# Patient Record
Sex: Female | Born: 1975
Health system: Southern US, Community
[De-identification: ages and names within clinical notes are randomized; demographics above are authoritative.]

## PROBLEM LIST (undated history)

## (undated) DIAGNOSIS — B019 Varicella without complication: Secondary | ICD-10-CM

## (undated) DIAGNOSIS — D219 Benign neoplasm of connective and other soft tissue, unspecified: Secondary | ICD-10-CM

## (undated) DIAGNOSIS — I429 Cardiomyopathy, unspecified: Secondary | ICD-10-CM

## (undated) DIAGNOSIS — I10 Essential (primary) hypertension: Secondary | ICD-10-CM

## (undated) DIAGNOSIS — N390 Urinary tract infection, site not specified: Secondary | ICD-10-CM

## (undated) DIAGNOSIS — R7303 Prediabetes: Secondary | ICD-10-CM

## (undated) DIAGNOSIS — K7689 Other specified diseases of liver: Secondary | ICD-10-CM

## (undated) DIAGNOSIS — J020 Streptococcal pharyngitis: Secondary | ICD-10-CM

## (undated) DIAGNOSIS — K219 Gastro-esophageal reflux disease without esophagitis: Secondary | ICD-10-CM

## (undated) HISTORY — DX: Gastro-esophageal reflux disease without esophagitis: K21.9

## (undated) HISTORY — DX: Essential (primary) hypertension: I10

## (undated) HISTORY — DX: Benign neoplasm of connective and other soft tissue, unspecified: D21.9

## (undated) HISTORY — DX: Cardiomyopathy, unspecified: I42.9

## (undated) HISTORY — DX: Prediabetes: R73.03

## (undated) HISTORY — DX: Other specified diseases of liver: K76.89

## (undated) HISTORY — DX: Urinary tract infection, site not specified: N39.0

## (undated) HISTORY — DX: Varicella without complication: B01.9

## (undated) HISTORY — DX: Streptococcal pharyngitis: J02.0

## (undated) HISTORY — PX: CHOLECYSTECTOMY: SHX55

---

## 2001-05-24 HISTORY — PX: CHOLECYSTECTOMY: SHX55

## 2004-06-16 ENCOUNTER — Inpatient Hospital Stay: Payer: Self-pay

## 2004-07-17 ENCOUNTER — Emergency Department: Payer: Self-pay | Admitting: Emergency Medicine

## 2004-12-23 ENCOUNTER — Inpatient Hospital Stay: Payer: Self-pay

## 2005-03-07 ENCOUNTER — Other Ambulatory Visit: Payer: Self-pay

## 2005-03-08 ENCOUNTER — Inpatient Hospital Stay: Payer: Self-pay | Admitting: Cardiovascular Disease

## 2005-06-27 ENCOUNTER — Emergency Department: Payer: Self-pay | Admitting: Emergency Medicine

## 2006-06-16 ENCOUNTER — Emergency Department: Payer: Self-pay | Admitting: Emergency Medicine

## 2006-06-20 ENCOUNTER — Ambulatory Visit: Payer: Self-pay | Admitting: Emergency Medicine

## 2006-09-02 ENCOUNTER — Ambulatory Visit: Payer: Self-pay | Admitting: Internal Medicine

## 2007-02-09 ENCOUNTER — Emergency Department: Payer: Self-pay | Admitting: Emergency Medicine

## 2008-01-31 ENCOUNTER — Emergency Department: Payer: Self-pay | Admitting: Emergency Medicine

## 2008-01-31 ENCOUNTER — Other Ambulatory Visit: Payer: Self-pay

## 2008-04-30 ENCOUNTER — Ambulatory Visit: Payer: Self-pay | Admitting: Gastroenterology

## 2009-09-08 ENCOUNTER — Ambulatory Visit: Payer: Self-pay | Admitting: General Practice

## 2009-09-09 ENCOUNTER — Ambulatory Visit: Payer: Self-pay | Admitting: General Practice

## 2013-02-15 ENCOUNTER — Encounter: Payer: Self-pay | Admitting: *Deleted

## 2013-02-15 ENCOUNTER — Telehealth: Payer: Self-pay | Admitting: *Deleted

## 2013-02-15 DIAGNOSIS — L732 Hidradenitis suppurativa: Secondary | ICD-10-CM

## 2013-02-15 MED ORDER — DOXYCYCLINE HYCLATE 100 MG PO TABS: 100.0000 mg | ORAL_TABLET | Freq: Every day | ORAL | Status: DC

## 2013-02-15 NOTE — Telephone Encounter (Signed)
Patient states that while she was on antibiotics the area is fine and wants to know if she can get a months RX.  She declined an appointment  And if she needs surgery she wants it next year.  She did get cardiac clearance last year for surgery, she sees Dr. Youlanda Mighty in [place].

## 2014-05-10 DIAGNOSIS — G4733 Obstructive sleep apnea (adult) (pediatric): Secondary | ICD-10-CM

## 2014-05-10 HISTORY — DX: Obstructive sleep apnea (adult) (pediatric): G47.33

## 2015-08-04 ENCOUNTER — Encounter: Payer: Self-pay | Admitting: Family Medicine

## 2015-08-04 ENCOUNTER — Ambulatory Visit (INDEPENDENT_AMBULATORY_CARE_PROVIDER_SITE_OTHER): Payer: BLUE CROSS/BLUE SHIELD | Admitting: Family Medicine

## 2015-08-04 VITALS — BP 110/76 | HR 88 | Temp 98.4°F | Ht 64.5 in | Wt 237.4 lb

## 2015-08-04 DIAGNOSIS — M545 Low back pain, unspecified: Secondary | ICD-10-CM | POA: Insufficient documentation

## 2015-08-04 DIAGNOSIS — L732 Hidradenitis suppurativa: Secondary | ICD-10-CM

## 2015-08-04 DIAGNOSIS — M6281 Muscle weakness (generalized): Secondary | ICD-10-CM | POA: Diagnosis not present

## 2015-08-04 DIAGNOSIS — R29898 Other symptoms and signs involving the musculoskeletal system: Secondary | ICD-10-CM

## 2015-08-04 DIAGNOSIS — R413 Other amnesia: Secondary | ICD-10-CM | POA: Insufficient documentation

## 2015-08-04 HISTORY — DX: Hidradenitis suppurativa: L73.2

## 2015-08-04 LAB — SEDIMENTATION RATE: Sed Rate: 24 mm/hr — ABNORMAL HIGH (ref 0–22)

## 2015-08-04 LAB — CBC
HEMATOCRIT: 36.4 % (ref 36.0–46.0)
Hemoglobin: 12 g/dL (ref 12.0–15.0)
MCHC: 33 g/dL (ref 30.0–36.0)
MCV: 85.3 fl (ref 78.0–100.0)
PLATELETS: 276 10*3/uL (ref 150.0–400.0)
RBC: 4.27 Mil/uL (ref 3.87–5.11)
RDW: 15.1 % (ref 11.5–15.5)
WBC: 8.5 10*3/uL (ref 4.0–10.5)

## 2015-08-04 LAB — COMPREHENSIVE METABOLIC PANEL
ALT: 13 U/L (ref 0–35)
AST: 14 U/L (ref 0–37)
Albumin: 4.2 g/dL (ref 3.5–5.2)
Alkaline Phosphatase: 66 U/L (ref 39–117)
BUN: 12 mg/dL (ref 6–23)
CALCIUM: 9.3 mg/dL (ref 8.4–10.5)
CHLORIDE: 103 meq/L (ref 96–112)
CO2: 29 meq/L (ref 19–32)
Creatinine, Ser: 0.82 mg/dL (ref 0.40–1.20)
GFR: 99.04 mL/min (ref >60.00)
Glucose, Bld: 86 mg/dL (ref 70–99)
POTASSIUM: 4 meq/L (ref 3.5–5.1)
Sodium: 138 mEq/L (ref 135–145)
Total Bilirubin: 0.5 mg/dL (ref 0.2–1.2)
Total Protein: 7.4 g/dL (ref 6.0–8.3)

## 2015-08-04 LAB — CK: Total CK: 75 U/L (ref 7–177)

## 2015-08-04 LAB — TSH: TSH: 0.54 u[IU]/mL (ref 0.35–4.50)

## 2015-08-04 MED ORDER — CHLORZOXAZONE 500 MG PO TABS: 250.0000 mg | ORAL_TABLET | Freq: Three times a day (TID) | ORAL | Status: DC | PRN

## 2015-08-04 NOTE — Assessment & Plan Note (Signed)
Patient reports subjective upper extremity weakness for a number of years. She is neurologically intact at this time. There is no muscle tenderness or swelling in her upper extremities. No neck pain with this to indicate nerve entrapment. Unsure of cause at this time. Could consider myasthenia gravis as possible. We'll check lab work as outlined below. If negative for cause would consider referral to neurology for further evaluation. Given return precautions.

## 2015-08-04 NOTE — Progress Notes (Signed)
Pre visit review using our clinic review tool, if applicable. No additional management support is needed unless otherwise documented below in the visit note. 

## 2015-08-04 NOTE — Progress Notes (Signed)
Patient ID: Stacie Gill, female   DOB: 1975-06-09, 40 y.o.   MRN: [id number]  Stacie Rumps, MD Phone: [phone]  Stacie Gill is a 40 y.o. female who presents today for new patient visit  Low back pain: Patient notes for the last 1.5 months she has had low back pain near the middle of her lower spine. Notes initially was stiff with difficulty standing straight up, then she developed quick and intense shooting pain in that one specific area. Other times the pain just sits there. One time she had discomfort down the lateral aspect of her right leg, though this has not happened in several weeks. She denies injury. She does note she fell on her back 4 years ago, though had no pain following that. She denies lower extremity numbness, weakness, bowel or bladder incontinence, saddle anesthesia, fevers, and history of cancer. She has been taking intermittent ibuprofen at 400 mg once a day.  Memory issues: Patient notes for a number of years she has had a hard time remembering certain things. Difficulty recalling dates from her past such as her wedding date. Difficulty recalling teacher names of her children. She notes it just takes her longer to recall these. She has no IADL her ADL issues. No family history of dementia.  Bilateral upper extremity weakness: Patient notes for years she has had weakness in her upper extremities. She notes this occurs if she uses her upper extremities for prolonged periods of time. She notes if she holds a TV remote up her arm will become weak to the point that she cannot hold it up any longer. No numbness. No tingling. No neck pain. It does not get progressively worse throughout the day. No vision changes. No muscle pain or shoulder pain. No shoulder stiffness.  Hidradenitis: Patient notes long history of bilateral armpit hidradenitis. She has seen a Psychologist, sport and exercise for this and it was advised that she have surgery for this. She did not proceed with this. She notes  intermittently she will develop lesions that will drain eventually on their own. No fevers. She wonders if seeing a dermatologist would be more beneficial.   Active Ambulatory Problems    Diagnosis Date Noted  . Low back pain 08/04/2015  . Upper extremity weakness 08/04/2015  . Memory change 08/04/2015  . Hidradenitis 08/04/2015   Resolved Ambulatory Problems    Diagnosis Date Noted  . No Resolved Ambulatory Problems   Past Medical History  Diagnosis Date  . Chickenpox   . Cardiomyopathy ([place])   . GERD (gastroesophageal reflux disease)   . Hypertension   . UTI (lower urinary tract infection)     Family History  Problem Relation Age of Onset  . Alcoholism      Uncle  . Arthritis      Grandparent  . Colon cancer Other     Great grandparent, great uncle  . Uterine cancer Maternal Aunt   . Breast cancer Cousin   . Prostate cancer Father   . Stroke Other     Great grandparent  . Hypertension      Parent  . Bone cancer      Uncle    Social History   Social History  . Marital Status: Married    Spouse Name: N/A  . Number of Children: N/A  . Years of Education: N/A   Occupational History  . Not on file.   Social History Main Topics  . Smoking status: Never Smoker   . Smokeless tobacco: Not on  file  . Alcohol Use: 0.0 oz/week    0 Standard drinks or equivalent per week     Comment: 1 a month  . Drug Use: No  . Sexual Activity: Not on file   Other Topics Concern  . Not on file   Social History Narrative  . No narrative on file    ROS   General:  Negative for nexplained weight loss, fever Skin: Negative for new or changing mole, sore that won't heal HEENT: Negative for trouble hearing, trouble seeing, ringing in ears, mouth sores, hoarseness, change in voice, dysphagia. CV:  Negative for chest pain, dyspnea, edema, palpitations Resp: Negative for cough, dyspnea, hemoptysis GI: Negative for nausea, vomiting, diarrhea, constipation, abdominal pain,  melena, hematochezia. GU: Negative for dysuria, incontinence, urinary hesitance, hematuria, vaginal or penile discharge, polyuria, sexual difficulty, lumps in testicle or breasts MSK: Positive for back pain, Negative for muscle cramps or aches, joint pain or swelling Neuro: Negative for headaches, weakness, numbness, dizziness, passing out/fainting Psych: Negative for depression, anxiety, positive for memory problems  Objective  Physical Exam Filed Vitals:   08/04/15 1326  BP: 110/76  Pulse: 88  Temp: 98.4 F (36.9 C)    BP Readings from Last 3 Encounters:  08/04/15 110/76   Wt Readings from Last 3 Encounters:  08/04/15 237 lb 6.4 oz (107.684 kg)    Physical Exam  Constitutional: She is well-developed, well-nourished, and in no distress.  HENT:  Head: Normocephalic and atraumatic.  Right Ear: External ear normal.  Left Ear: External ear normal.  Mouth/Throat: Oropharynx is clear and moist. No oropharyngeal exudate.  Eyes: Conjunctivae are normal. Pupils are equal, round, and reactive to light.  Neck: Neck supple.  Cardiovascular: Normal rate, regular rhythm and normal heart sounds.  Exam reveals no gallop and no friction rub.   No murmur heard. Pulmonary/Chest: Effort normal and breath sounds normal. No respiratory distress. She has no wheezes. She has no rales.  Abdominal: Soft. Bowel sounds are normal. She exhibits no distension. There is no tenderness. There is no rebound and no guarding.  Musculoskeletal: She exhibits no edema or tenderness (in bilateral upper extremities).  No midline spine tenderness, no midline spine step-off, minimal tenderness over the right lumbar paraspinous muscles at about the level of the iliac crest, no swelling or skin changes, no other tenderness in her back  Lymphadenopathy:    She has no cervical adenopathy.  Neurological: She is alert.  5/5 strength in bilateral biceps, triceps, grip, deltoids, quads, hamstrings, plantar and dorsiflexion,  sensation to light touch intact in bilateral UE and LE, normal gait, 2+ patellar reflexes  Skin: Skin is warm and dry. She is not diaphoretic.  Bilateral axillas with small firm palpable nodules with no fluctuance, warmth, or overlying erythema  Psychiatric: Mood, memory and affect normal.  5 out of 5 on mini cog testing     Assessment/Plan:   Low back pain Low back pain likely muscle strain versus muscular injury. Neurologically intact. No red flags. Discussed no role for imaging at this time. We'll treat with muscle relaxer chlorzoxazone. Discussed minimizing use of ibuprofen given her history of cardiomyopathy. Advised heat. Given exercises and stretches to complete. Patient will continue to monitor. She's given return precautions.  Upper extremity weakness Patient reports subjective upper extremity weakness for a number of years. She is neurologically intact at this time. There is no muscle tenderness or swelling in her upper extremities. No neck pain with this to indicate nerve entrapment. Unsure of  cause at this time. Could consider myasthenia gravis as possible. We'll check lab work as outlined below. If negative for cause would consider referral to neurology for further evaluation. Given return precautions.  Memory change Memory appears to be intact today. Passed the mini cog. No IADL or ADL deficits. We will continue to monitor this. If worsens or remains persistent would consider lab workup of this issue versus referral to neurology. Given return precautions.  Hidradenitis Chronic issue. No signs of acute infection at this time. Can use warm compresses. We will refer to dermatology for second opinion and further evaluation. Given return precautions.    Orders Placed This Encounter  Procedures  . Comp Met (CMET)  . CBC  . CK (Creatine Kinase)  . Sed Rate (ESR)  . TSH  . Ambulatory referral to Dermatology    Referral Priority:  Routine    Referral Type:  Consultation     Referral Reason:  Specialty Services Required    Requested Specialty:  Dermatology    Number of Visits Requested:  1    Stacie Rumps, MD [place]

## 2015-08-04 NOTE — Patient Instructions (Signed)
Nice to meet you. We will treat your back pain with a muscle relaxer called chlorzoxazone. You can take this up to every 8 hours as needed for your back pain. I would try to limit ibuprofen use given your cardiomyopathy. Please continue to monitor your memory area. If you have further issues with this please let us know. You can additionally use heat and the following stretches and exercises for your back pain. If you develop worsening back pain, numbness, weakness, loss of bowel or bladder function, numbness between her legs, fevers, or any new or changing symptoms please seek medical attention.  Back Exercises The following exercises strengthen the muscles that help to support the back. They also help to keep the lower back flexible. Doing these exercises can help to prevent back pain or lessen existing pain. If you have back pain or discomfort, try doing these exercises 2-3 times each day or as told by your health care provider. When the pain goes away, do them once each day, but increase the number of times that you repeat the steps for each exercise (do more repetitions). If you do not have back pain or discomfort, do these exercises once each day or as told by your health care provider. EXERCISES Single Knee to Chest Repeat these steps 3-5 times for each leg: 1. Lie on your back on a firm bed or the floor with your legs extended. 2. Bring one knee to your chest. Your other leg should stay extended and in contact with the floor. 3. Hold your knee in place by grabbing your knee or thigh. 4. Pull on your knee until you feel a gentle stretch in your lower back. 5. Hold the stretch for 10-30 seconds. 6. Slowly release and straighten your leg. Pelvic Tilt Repeat these steps 5-10 times: 1. Lie on your back on a firm bed or the floor with your legs extended. 2. Bend your knees so they are pointing toward the ceiling and your feet are flat on the floor. 3. Tighten your lower abdominal muscles to  press your lower back against the floor. This motion will tilt your pelvis so your tailbone points up toward the ceiling instead of pointing to your feet or the floor. 4. With gentle tension and even breathing, hold this position for 5-10 seconds. Cat-Cow Repeat these steps until your lower back becomes more flexible: 1. Get into a hands-and-knees position on a firm surface. Keep your hands under your shoulders, and keep your knees under your hips. You may place padding under your knees for comfort. 2. Let your head hang down, and point your tailbone toward the floor so your lower back becomes rounded like the back of a cat. 3. Hold this position for 5 seconds. 4. Slowly lift your head and point your tailbone up toward the ceiling so your back forms a sagging arch like the back of a cow. 5. Hold this position for 5 seconds. Press-Ups Repeat these steps 5-10 times: 1. Lie on your abdomen (face-down) on the floor. 2. Place your palms near your head, about shoulder-width apart. 3. While you keep your back as relaxed as possible and keep your hips on the floor, slowly straighten your arms to raise the top half of your body and lift your shoulders. Do not use your back muscles to raise your upper torso. You may adjust the placement of your hands to make yourself more comfortable. 4. Hold this position for 5 seconds while you keep your back relaxed. 5. Slowly  return to lying flat on the floor. Bridges Repeat these steps 10 times: 1. Lie on your back on a firm surface. 2. Bend your knees so they are pointing toward the ceiling and your feet are flat on the floor. 3. Tighten your buttocks muscles and lift your buttocks off of the floor until your waist is at almost the same height as your knees. You should feel the muscles working in your buttocks and the back of your thighs. If you do not feel these muscles, slide your feet 1-2 inches farther away from your buttocks. 4. Hold this position for 3-5  seconds. 5. Slowly lower your hips to the starting position, and allow your buttocks muscles to relax completely. If this exercise is too easy, try doing it with your arms crossed over your chest. Abdominal Crunches Repeat these steps 5-10 times: 1. Lie on your back on a firm bed or the floor with your legs extended. 2. Bend your knees so they are pointing toward the ceiling and your feet are flat on the floor. 3. Cross your arms over your chest. 4. Tip your chin slightly toward your chest without bending your neck. 5. Tighten your abdominal muscles and slowly raise your trunk (torso) high enough to lift your shoulder blades a tiny bit off of the floor. Avoid raising your torso higher than that, because it can put too much stress on your low back and it does not help to strengthen your abdominal muscles. 6. Slowly return to your starting position. Back Lifts Repeat these steps 5-10 times: 1. Lie on your abdomen (face-down) with your arms at your sides, and rest your forehead on the floor. 2. Tighten the muscles in your legs and your buttocks. 3. Slowly lift your chest off of the floor while you keep your hips pressed to the floor. Keep the back of your head in line with the curve in your back. Your eyes should be looking at the floor. 4. Hold this position for 3-5 seconds. 5. Slowly return to your starting position. SEEK MEDICAL CARE IF:  Your back pain or discomfort gets much worse when you do an exercise.  Your back pain or discomfort does not lessen within 2 hours after you exercise. If you have any of these problems, stop doing these exercises right away. Do not do them again unless your health care provider says that you can. SEEK IMMEDIATE MEDICAL CARE IF:  You develop sudden, severe back pain. If this happens, stop doing the exercises right away. Do not do them again unless your health care provider says that you can.   This information is not intended to replace advice given to  you by your health care provider. Make sure you discuss any questions you have with your health care provider.   Document Released: 06/17/2004 Document Revised: 01/29/2015 Document Reviewed: 07/04/2014 Elsevier Interactive Patient Education [employer].

## 2015-08-04 NOTE — Assessment & Plan Note (Signed)
Low back pain likely muscle strain versus muscular injury. Neurologically intact. No red flags. Discussed no role for imaging at this time. We'll treat with muscle relaxer chlorzoxazone. Discussed minimizing use of ibuprofen given her history of cardiomyopathy. Advised heat. Given exercises and stretches to complete. Patient will continue to monitor. She's given return precautions.

## 2015-08-04 NOTE — Assessment & Plan Note (Signed)
Memory appears to be intact today. Passed the mini cog. No IADL or ADL deficits. We will continue to monitor this. If worsens or remains persistent would consider lab workup of this issue versus referral to neurology. Given return precautions.

## 2015-08-04 NOTE — Assessment & Plan Note (Signed)
Chronic issue. No signs of acute infection at this time. Can use warm compresses. We will refer to dermatology for second opinion and further evaluation. Given return precautions.

## 2015-08-05 ENCOUNTER — Encounter: Payer: Self-pay | Admitting: Family Medicine

## 2015-08-13 ENCOUNTER — Other Ambulatory Visit: Payer: Self-pay | Admitting: Family Medicine

## 2015-08-13 DIAGNOSIS — R700 Elevated erythrocyte sedimentation rate: Secondary | ICD-10-CM

## 2015-08-18 ENCOUNTER — Other Ambulatory Visit (INDEPENDENT_AMBULATORY_CARE_PROVIDER_SITE_OTHER): Payer: BLUE CROSS/BLUE SHIELD

## 2015-08-18 DIAGNOSIS — R700 Elevated erythrocyte sedimentation rate: Secondary | ICD-10-CM | POA: Diagnosis not present

## 2015-08-18 LAB — SEDIMENTATION RATE: SED RATE: 24 mm/h — AB (ref 0–22)

## 2015-08-21 LAB — HM MAMMOGRAPHY

## 2015-08-23 ENCOUNTER — Encounter: Payer: Self-pay | Admitting: Gynecology

## 2015-08-23 ENCOUNTER — Ambulatory Visit
Admission: EM | Admit: 2015-08-23 | Discharge: 2015-08-23 | Disposition: A | Discharge status: Home / Self Care | Payer: BLUE CROSS/BLUE SHIELD | Attending: Family Medicine | Admitting: Family Medicine

## 2015-08-23 DIAGNOSIS — J020 Streptococcal pharyngitis: Secondary | ICD-10-CM

## 2015-08-23 LAB — RAPID STREP SCREEN (MED CTR MEBANE ONLY): Streptococcus, Group A Screen (Direct): POSITIVE — AB

## 2015-08-23 MED ORDER — PENICILLIN G BENZATHINE 1200000 UNIT/2ML IM SUSP
1.2000 10*6.[IU] | Freq: Once | INTRAMUSCULAR | Status: AC
  Administered 2015-08-23: 1.2 10*6.[IU] via INTRAMUSCULAR

## 2015-08-23 NOTE — ED Provider Notes (Signed)
CSN: [id number]     Arrival date & time 08/23/15  1539 History   First MD Initiated Contact with Patient 08/23/15 1549    Nurses notes were reviewed. Chief Complaint  Patient presents with  . Sore Throat   Up patient reports having a sore throat for 2 days. She states that no symptoms of flu but still just continued to be sore. Past medical history she's had a history of card to myopathy at the childbirth which course ended any further childbirth in the near future. She has a history of multiple types of cancers in family members and hypertension as well. She has a history of hypertension and she does not smoke. No known drug allergies.    (Consider location/radiation/quality/duration/timing/severity/associated sxs/prior Treatment) Patient is a 40 y.o. female presenting with pharyngitis. The history is provided by the patient. No language interpreter was used.  Sore Throat This is a new problem. The current episode started yesterday. The problem occurs constantly. The problem has been gradually worsening. Pertinent negatives include no chest pain, no abdominal pain, no headaches and no shortness of breath. Nothing aggravates the symptoms. Nothing relieves the symptoms. She has tried nothing for the symptoms. The treatment provided no relief.    Past Medical History  Diagnosis Date  . Chickenpox   . Cardiomyopathy ([place])   . GERD (gastroesophageal reflux disease)   . Hypertension   . UTI (lower urinary tract infection)    Past Surgical History  Procedure Laterality Date  . Cholecystectomy  2003   Family History  Problem Relation Age of Onset  . Alcoholism      Uncle  . Arthritis      Grandparent  . Colon cancer Other     Great grandparent, great uncle  . Uterine cancer Maternal Aunt   . Breast cancer Cousin   . Prostate cancer Father   . Stroke Other     Great grandparent  . Hypertension      Parent  . Bone cancer      Uncle   Social History  Substance Use Topics  .  Smoking status: Never Smoker   . Smokeless tobacco: None  . Alcohol Use: 0.0 oz/week    0 Standard drinks or equivalent per week     Comment: 1 a month   OB History    No data available     Review of Systems  Constitutional: Negative for activity change.  HENT: Positive for sore throat.   Respiratory: Negative for shortness of breath.   Cardiovascular: Negative for chest pain.  Gastrointestinal: Negative for abdominal pain.  Neurological: Negative for headaches.  All other systems reviewed and are negative.   Allergies  Review of patient's allergies indicates no known allergies.  Home Medications   Prior to Admission medications   Medication Sig Start Date End Date Taking? Authorizing Provider  amLODipine (NORVASC) 5 MG tablet  08/06/13  Yes Historical Provider, MD  carvedilol (COREG CR) 40 MG 24 hr capsule Take by mouth. 03/29/14  Yes Historical Provider, MD  chlorzoxazone (PARAFON) 500 MG tablet Take 0.5 tablets (250 mg total) by mouth 3 (three) times daily as needed for muscle spasms. 08/04/15  Yes Leone Haven, MD  Esomeprazole Magnesium (NEXIUM PO) Take by mouth as needed.   Yes Historical Provider, MD  ramipril (ALTACE) 10 MG capsule  08/06/13  Yes Historical Provider, MD  spironolactone (ALDACTONE) 25 MG tablet  08/06/13  Yes Historical Provider, MD  valACYclovir (VALTREX) 500 MG tablet Take by  mouth. 08/13/10  Yes Historical Provider, MD   Meds Ordered and Administered this Visit   Medications  penicillin g benzathine (BICILLIN LA) 1200000 UNIT/2ML injection 1.2 Million Units (1.2 Million Units Intramuscular Given 08/23/15 1736)    BP 100/60 mmHg  Pulse 113  Temp(Src) 99.5 F (37.5 C) (Oral)  Resp 18  Ht 5\' 4"  (1.626 m)  Wt 227 lb (102.967 kg)  BMI 38.95 kg/m2  SpO2 100% No data found.   Physical Exam  Constitutional: She is oriented to person, place, and time. She appears well-developed.  HENT:  Head: Normocephalic and atraumatic.  Right Ear: Hearing,  tympanic membrane, external ear and ear canal normal.  Left Ear: Hearing, tympanic membrane, external ear and ear canal normal.  Nose: No mucosal edema or rhinorrhea.  Mouth/Throat: Uvula is midline. Oropharyngeal exudate and posterior oropharyngeal erythema present.  Eyes: Conjunctivae are normal. Pupils are equal, round, and reactive to light.  Neck: Normal range of motion. No tracheal deviation present.  Cardiovascular: Normal rate and regular rhythm.   Pulmonary/Chest: Effort normal and breath sounds normal.  Musculoskeletal: Normal range of motion.  Lymphadenopathy:    She has cervical adenopathy.  Neurological: She is alert and oriented to person, place, and time.  Skin: Skin is warm and dry.  Psychiatric: She has a normal mood and affect. Her behavior is normal.  Vitals reviewed.   ED Course  Procedures (including critical care time)  Labs Review Labs Reviewed  RAPID STREP SCREEN (NOT AT [facility]) - Abnormal; Notable for the following:    Streptococcus, Group A Screen (Direct) POSITIVE (*)    All other components within normal limits    Imaging Review No results found.   Visual Acuity Review  Right Eye Distance:   Left Eye Distance:   Bilateral Distance:    Right Eye Near:   Left Eye Near:    Bilateral Near:      Results for orders placed or performed during the hospital encounter of 08/23/15  Rapid strep screen  Result Value Ref Range   Streptococcus, Group A Screen (Direct) POSITIVE (A) NEGATIVE     MDM   1. Strep pharyngitis    Will administer LA Bicillin 1.2 million units today after strep test was positive. Work note for patient return to work on Monday. Follow-up PCP if not better in 1-2 weeks.    Frederich Cha, MD 08/23/15 [phone]

## 2015-08-23 NOTE — ED Notes (Signed)
Patient c/o swollen tonsil x yesterday.

## 2015-08-23 NOTE — Discharge Instructions (Signed)
Pharyngitis Pharyngitis is a sore throat (pharynx). There is redness, pain, and swelling of your throat. HOME CARE   Drink enough fluids to keep your pee (urine) clear or pale yellow.  Only take medicine as told by your doctor.  You may get sick again if you do not take medicine as told. Finish your medicines, even if you start to feel better.  Do not take aspirin.  Rest.  Rinse your mouth (gargle) with salt water ( tsp of salt per 1 qt of water) every 1-2 hours. This will help the pain.  If you are not at risk for choking, you can suck on hard candy or sore throat lozenges. GET HELP IF:  You have large, tender lumps on your neck.  You have a rash.  You cough up green, yellow-brown, or bloody spit. GET HELP RIGHT AWAY IF:   You have a stiff neck.  You drool or cannot swallow liquids.  You throw up (vomit) or are not able to keep medicine or liquids down.  You have very bad pain that does not go away with medicine.  You have problems breathing (not from a stuffy nose). MAKE SURE YOU:   Understand these instructions.  Will watch your condition.  Will get help right away if you are not doing well or get worse.   This information is not intended to replace advice given to you by your health care provider. Make sure you discuss any questions you have with your health care provider.   Document Released: 10/27/2007 Document Revised: 02/28/2013 Document Reviewed: 01/15/2013 Elsevier Interactive Patient Education 2016 [employer]  Strep Throat Strep throat is an infection of the throat. It is caused by germs. Strep throat spreads from person to person because of coughing, sneezing, or close contact. HOME CARE Medicines  Take over-the-counter and prescription medicines only as told by your doctor.  Take your antibiotic medicine as told by your doctor. Do not stop taking the medicine even if you feel better.  Have family members who also have a sore throat or fever  go to a doctor. Eating and Drinking  Do not share food, drinking cups, or personal items.  Try eating soft foods until your sore throat feels better.  Drink enough fluid to keep your pee (urine) clear or pale yellow. General Instructions  Rinse your mouth (gargle) with a salt-water mixture 3-4 times per day or as needed. To make a salt-water mixture, stir -1 tsp of salt into 1 cup of warm water.  Make sure that all people in your house wash their hands well.  Rest.  Stay home from school or work until you have been taking antibiotics for 24 hours.  Keep all follow-up visits as told by your doctor. This is important. GET HELP IF:  Your neck keeps getting bigger.  You get a rash, cough, or earache.  You cough up thick liquid that is green, yellow-brown, or bloody.  You have pain that does not get better with medicine.  Your problems get worse instead of getting better.  You have a fever. GET HELP RIGHT AWAY IF:  You throw up (vomit).  You get a very bad headache.  You neck hurts or it feels stiff.  You have chest pain or you are short of breath.  You have drooling, very bad throat pain, or changes in your voice.  Your neck is swollen or the skin gets red and tender.  Your mouth is dry or you are peeing less than  normal.  You keep feeling more tired or it is hard to wake up.  Your joints are red or they hurt.   This information is not intended to replace advice given to you by your health care provider. Make sure you discuss any questions you have with your health care provider.   Document Released: 10/27/2007 Document Revised: 01/29/2015 Document Reviewed: 09/02/2014 Elsevier Interactive Patient Education [employer].

## 2015-08-25 ENCOUNTER — Telehealth: Payer: Self-pay | Admitting: Family Medicine

## 2015-08-25 DIAGNOSIS — I9589 Other hypotension: Secondary | ICD-10-CM | POA: Diagnosis not present

## 2015-08-25 DIAGNOSIS — I10 Essential (primary) hypertension: Secondary | ICD-10-CM | POA: Diagnosis not present

## 2015-08-25 DIAGNOSIS — I5022 Chronic systolic (congestive) heart failure: Secondary | ICD-10-CM | POA: Diagnosis not present

## 2015-08-25 DIAGNOSIS — R0609 Other forms of dyspnea: Secondary | ICD-10-CM | POA: Diagnosis not present

## 2015-08-25 DIAGNOSIS — O903 Peripartum cardiomyopathy: Secondary | ICD-10-CM | POA: Diagnosis not present

## 2015-08-25 DIAGNOSIS — R29898 Other symptoms and signs involving the musculoskeletal system: Secondary | ICD-10-CM

## 2015-08-25 NOTE — Telephone Encounter (Signed)
See that a new sed rate was done, but not reported to the patient yet.  Please advise and I will call her with results. thanks

## 2015-08-25 NOTE — Telephone Encounter (Signed)
Pt called to follow up on her lab results. Pt would like if possible to get lab results today before she goes and she her cardiologist so he does not have to run the same test. Call pt @ [phone]. Thank you!

## 2015-08-25 NOTE — Telephone Encounter (Signed)
Sedimentation rate unchanged. Would recommend further evaluation given her weakness. Can place a referral if she is willing.

## 2015-08-25 NOTE — Telephone Encounter (Signed)
Spoke with patient and verbalized lab results.  She is willing for the referral.   FYI she is on the way to [facility] to see Dr. Edwin Dada for a Echo today her BP and Pulse are decreased.  She will follow up after with you.

## 2015-08-29 NOTE — Telephone Encounter (Signed)
Referral to neurology placed.

## 2015-09-04 ENCOUNTER — Ambulatory Visit: Payer: BLUE CROSS/BLUE SHIELD | Admitting: Family Medicine

## 2015-09-04 DIAGNOSIS — Z0289 Encounter for other administrative examinations: Secondary | ICD-10-CM

## 2015-10-10 ENCOUNTER — Encounter: Payer: Self-pay | Admitting: Neurology

## 2015-10-10 ENCOUNTER — Ambulatory Visit (INDEPENDENT_AMBULATORY_CARE_PROVIDER_SITE_OTHER): Payer: BLUE CROSS/BLUE SHIELD | Admitting: Neurology

## 2015-10-10 VITALS — BP 118/74 | HR 84 | Ht 64.5 in | Wt 234.3 lb

## 2015-10-10 DIAGNOSIS — R29898 Other symptoms and signs involving the musculoskeletal system: Secondary | ICD-10-CM | POA: Diagnosis not present

## 2015-10-10 NOTE — Progress Notes (Signed)
[facility] Clinic Note - Initial Visit   Date: 10/10/2015  Cardelia Rochlin MRN: [id number] DOB: 27-Jan-1976   Dear Dr. Tommi Rumps, MD:  Thank you for your kind referral of Stacie Gill for consultation of bilateral extremity weakness. Although her history is well known to you, please allow [name] to reiterate it for the purpose of our medical record. The patient was accompanied to the clinic by self.     History of Present Illness: Stacie Gill is a 40 y.o. right-handed African American female with GERD, hypertension, post-partum cardiomyopathy presenting for evaluation of proximal arm fatigue.    For the past several years, she feels as if her arms get very tired.  This is most noticeable with repeated activity requiring her arms to be extended such as with arms outstretched to flip channels, doing her daughter's hair, or even doing her own hair.  If she rests her arms for about 10-minutes, she is usually able to resume her activity.  She denies any neck pain, numbness/tingling of the hands.   She has stiffness of the hands which is worse morning and improved after moving her fingers. There is no pain or cramping of the hands.    Of note, she reports that she has hidradenitis and limits repeated activty of the proximal arms, because it tends to exacerbate symptoms.  She recently established care with a dermatologist who has started a new topical ointment as well as given antibiotics for acute exacerbation, so is willing to try to exercise more.   Out-side paper records, electronic medical record, and images have been reviewed where available and summarized as:  Lab Results  Component Value Date   TSH 0.54 08/04/2015   Lab Results  Component Value Date   ESRSEDRATE 24* 08/18/2015   Lab Results  Component Value Date   CKTOTAL 75 08/04/2015      Past Medical History  Diagnosis Date  . Chickenpox   . Cardiomyopathy ([place])   . GERD  (gastroesophageal reflux disease)   . Hypertension   . UTI (lower urinary tract infection)     Past Surgical History  Procedure Laterality Date  . Cholecystectomy  2003     Medications:  Outpatient Encounter Prescriptions as of 10/10/2015  Medication Sig Note  . carvedilol (COREG CR) 40 MG 24 hr capsule Take by mouth. 08/04/2015: Received from: [facility]  . chlorzoxazone (PARAFON) 500 MG tablet Take 0.5 tablets (250 mg total) by mouth 3 (three) times daily as needed for muscle spasms.   . clindamycin (CLEOCIN T) 1 % lotion  10/10/2015: Received from: External Pharmacy  . Esomeprazole Magnesium (NEXIUM PO) Take by mouth as needed.   . minocycline (MINOCIN,DYNACIN) 100 MG capsule Take 100 mg by mouth 2 (two) times daily.   . ramipril (ALTACE) 10 MG capsule  08/04/2015: Received from: [facility]  . valACYclovir (VALTREX) 500 MG tablet Take by mouth. 08/04/2015: Received from: [facility]  . Vitamin D, Ergocalciferol, (DRISDOL) 50000 units CAPS capsule  10/10/2015: Received from: External Pharmacy  . [DISCONTINUED] amLODipine (NORVASC) 5 MG tablet  08/04/2015: Received from: [facility]  . [DISCONTINUED] spironolactone (ALDACTONE) 25 MG tablet  08/04/2015: Received from: [facility]   No facility-administered encounter medications on file as of 10/10/2015.     Allergies: No Known Allergies  Family History: Family History  Problem Relation Age of Onset  . Alcoholism      Uncle  .  Arthritis      Grandparent  . Colon cancer Other     Great grandparent, great uncle  . Uterine cancer Maternal Aunt   . Breast cancer Cousin   . Prostate cancer Father   . Stroke Other     Great grandparent  . Hypertension      Parent  . Bone cancer      Uncle    Social History: Social History  Substance Use Topics  . Smoking status: Never Smoker   . Smokeless tobacco: None  . Alcohol Use: 0.0 oz/week    0  Standard drinks or equivalent per week     Comment: 1 a month   Social History   Social History Narrative    Review of Systems:  CONSTITUTIONAL: No fevers, chills, night sweats, or weight loss.   EYES: No visual changes or eye pain ENT: No hearing changes.  No history of nose bleeds.   RESPIRATORY: No cough, wheezing and shortness of breath.   CARDIOVASCULAR: Negative for chest pain, and palpitations.   GI: Negative for abdominal discomfort, blood in stools or black stools.  No recent change in bowel habits.   GU:  No history of incontinence.   MUSCLOSKELETAL: No history of joint pain or swelling.  No myalgias.   SKIN: +for lesions, rash, and itching.   HEMATOLOGY/ONCOLOGY: Negative for prolonged bleeding, bruising easily, and swollen nodes.  No history of cancer.   ENDOCRINE: Negative for cold or heat intolerance, polydipsia or goiter.   PSYCH:  No depression or anxiety symptoms.   NEURO: As Above.   Vital Signs:  BP 118/74 mmHg  Pulse 84  Ht 5' 4.5" (1.638 m)  Wt 234 lb 5 oz (106.283 kg)  BMI 39.61 kg/m2  SpO2 98%   General Medical Exam:   General:  Well appearing, comfortable.   Eyes/ENT: see cranial nerve examination.   Neck: No masses appreciated.  Full range of motion without tenderness.  No carotid bruits. Respiratory:  Clear to auscultation, good air entry bilaterally.   Cardiac:  Regular rate and rhythm, no murmur.   Extremities:  No deformities, edema, or skin discoloration.  Skin:  No rashes or lesions.  Neurological Exam: MENTAL STATUS including orientation to time, place, person, recent and remote memory, attention span and concentration, language, and fund of knowledge is normal.  Speech is not dysarthric.  CRANIAL NERVES: II:  No visual field defects.  Unremarkable fundi.   III-IV-VI: Pupils equal round and reactive to light.  Normal conjugate, extra-ocular eye movements in all directions of gaze.  No nystagmus.  No ptosis.   V:  Normal facial sensation.    VII:  Normal facial symmetry and movements.  No pathologic facial reflexes.  VIII:  Normal hearing and vestibular function.   IX-X:  Normal palatal movement.   XI:  Normal shoulder shrug and head rotation.   XII:  Normal tongue strength and range of motion, no deviation or fasciculation.  MOTOR:  No atrophy, fasciculations or abnormal movements.  No pronator drift.  Tone is normal.  No muscle fatigability  Right Upper Extremity:    Left Upper Extremity:    Deltoid  5/5   Deltoid  5/5   Biceps  5/5   Biceps  5/5   Triceps  5/5   Triceps  5/5   Wrist extensors  5/5   Wrist extensors  5/5   Wrist flexors  5/5   Wrist flexors  5/5   Finger extensors  5/5  Finger extensors  5/5   Finger flexors  5/5   Finger flexors  5/5   Dorsal interossei  5/5   Dorsal interossei  5/5   Abductor pollicis  5/5   Abductor pollicis  5/5   Tone (Ashworth scale)  0  Tone (Ashworth scale)  0   Right Lower Extremity:    Left Lower Extremity:    Hip flexors  5/5   Hip flexors  5/5   Hip extensors  5/5   Hip extensors  5/5   Knee flexors  5/5   Knee flexors  5/5   Knee extensors  5/5   Knee extensors  5/5   Dorsiflexors  5/5   Dorsiflexors  5/5   Plantarflexors  5/5   Plantarflexors  5/5   Toe extensors  5/5   Toe extensors  5/5   Toe flexors  5/5   Toe flexors  5/5   Tone (Ashworth scale)  0  Tone (Ashworth scale)  0   MSRs:  Right                                                                 Left brachioradialis 2+  brachioradialis 2+  biceps 2+  biceps 2+  triceps 2+  triceps 2+  patellar 2+  patellar 2+  ankle jerk 2+  ankle jerk 2+  Hoffman no  Hoffman no  plantar response down  plantar response down   SENSORY:  Normal and symmetric perception of light touch, pinprick, vibration, and proprioception.  Romberg's sign absent.   COORDINATION/GAIT: Normal finger-to- nose-finger and heel-to-shin.  Intact rapid alternating movements bilaterally.  Able to rise from a chair without using arms.  Gait  narrow based and stable. Tandem and stressed gait intact.    IMPRESSION: Ms. Setzler is a delightful 40 year-old female referred for evaluation of subjective proximal arm weakness.  Her exam is entirely normal and without signs of fatigability or myalgias.  I reassured her that I did not see anything worrisome on her exam.  Her very mildly elevated sedimentation rate is nonspecific and nothing to be concerned about, especially with normal exam and CK.  We discussed obtaining NCS/EMG of the upper extremities, but the overall yield of finding an abnormality is very low with a normal exam.  It is interesting that she reports limits use of her proximal arms because it aggravates her axilla hidradenitis, which suggest the possibility of deconditioning moreso that true neuromuscular weakness.   She will start her own home exercise program for proximal arm strengthening.  If no improvement, we can refer her for formal physical therapy and/or pursue additional testing.   The duration of this appointment visit was 35 minutes of face-to-face time with the patient.  Greater than 50% of this time was spent in counseling, explanation of diagnosis, planning of further management, and coordination of care.   Thank you for allowing me to participate in patient's care.  If I can answer any additional questions, I would be pleased to do so.    Sincerely,    Francesco Provencal K. Posey Pronto, DO

## 2015-10-10 NOTE — Patient Instructions (Signed)
Recommend starting home exercise program for arm strengthening Call my office if your symptoms do not improve

## 2015-10-14 ENCOUNTER — Encounter: Payer: Self-pay | Admitting: Surgical

## 2017-03-10 DIAGNOSIS — I5020 Unspecified systolic (congestive) heart failure: Secondary | ICD-10-CM | POA: Diagnosis not present

## 2017-06-02 ENCOUNTER — Other Ambulatory Visit: Payer: Self-pay | Admitting: Obstetrics & Gynecology

## 2017-06-02 DIAGNOSIS — Z1231 Encounter for screening mammogram for malignant neoplasm of breast: Secondary | ICD-10-CM | POA: Diagnosis not present

## 2017-06-02 DIAGNOSIS — Z01419 Encounter for gynecological examination (general) (routine) without abnormal findings: Secondary | ICD-10-CM | POA: Diagnosis not present

## 2017-06-02 DIAGNOSIS — Z30432 Encounter for removal of intrauterine contraceptive device: Secondary | ICD-10-CM | POA: Diagnosis not present

## 2017-06-02 DIAGNOSIS — E538 Deficiency of other specified B group vitamins: Secondary | ICD-10-CM | POA: Diagnosis not present

## 2017-06-02 DIAGNOSIS — Z113 Encounter for screening for infections with a predominantly sexual mode of transmission: Secondary | ICD-10-CM | POA: Diagnosis not present

## 2017-06-02 DIAGNOSIS — Z0000 Encounter for general adult medical examination without abnormal findings: Secondary | ICD-10-CM | POA: Diagnosis not present

## 2017-06-02 DIAGNOSIS — Z30430 Encounter for insertion of intrauterine contraceptive device: Secondary | ICD-10-CM | POA: Diagnosis not present

## 2017-06-02 DIAGNOSIS — R5383 Other fatigue: Secondary | ICD-10-CM | POA: Diagnosis not present

## 2017-06-16 DIAGNOSIS — E876 Hypokalemia: Secondary | ICD-10-CM | POA: Diagnosis not present

## 2017-06-16 DIAGNOSIS — I4581 Long QT syndrome: Secondary | ICD-10-CM | POA: Diagnosis not present

## 2017-06-16 DIAGNOSIS — Z5181 Encounter for therapeutic drug level monitoring: Secondary | ICD-10-CM | POA: Diagnosis not present

## 2017-06-16 DIAGNOSIS — Z9049 Acquired absence of other specified parts of digestive tract: Secondary | ICD-10-CM | POA: Diagnosis not present

## 2017-06-16 DIAGNOSIS — R0602 Shortness of breath: Secondary | ICD-10-CM | POA: Diagnosis not present

## 2017-06-16 DIAGNOSIS — E538 Deficiency of other specified B group vitamins: Secondary | ICD-10-CM | POA: Diagnosis not present

## 2017-06-16 DIAGNOSIS — R0609 Other forms of dyspnea: Secondary | ICD-10-CM | POA: Diagnosis not present

## 2017-06-16 DIAGNOSIS — R079 Chest pain, unspecified: Secondary | ICD-10-CM | POA: Diagnosis not present

## 2017-06-16 DIAGNOSIS — E559 Vitamin D deficiency, unspecified: Secondary | ICD-10-CM | POA: Diagnosis not present

## 2017-06-16 DIAGNOSIS — I440 Atrioventricular block, first degree: Secondary | ICD-10-CM | POA: Diagnosis not present

## 2017-06-16 DIAGNOSIS — R002 Palpitations: Secondary | ICD-10-CM | POA: Diagnosis not present

## 2017-06-16 DIAGNOSIS — R0789 Other chest pain: Secondary | ICD-10-CM | POA: Diagnosis not present

## 2017-06-16 DIAGNOSIS — R000 Tachycardia, unspecified: Secondary | ICD-10-CM | POA: Diagnosis not present

## 2017-06-21 ENCOUNTER — Ambulatory Visit
Admission: RE | Admit: 2017-06-21 | Discharge: 2017-06-21 | Disposition: A | Discharge status: Home / Self Care | Payer: BLUE CROSS/BLUE SHIELD | Source: Ambulatory Visit | Attending: Obstetrics & Gynecology | Admitting: Obstetrics & Gynecology

## 2017-06-21 DIAGNOSIS — Z1231 Encounter for screening mammogram for malignant neoplasm of breast: Secondary | ICD-10-CM | POA: Diagnosis not present

## 2017-07-13 ENCOUNTER — Telehealth: Payer: Self-pay | Admitting: Family Medicine

## 2017-07-13 NOTE — Telephone Encounter (Signed)
Pt was at [facility] on 06/16/17 in ER. She would like to speak to Dr. Caryl Bis about her test results, lung nodule. Please call her at [phone].

## 2017-07-13 NOTE — Telephone Encounter (Signed)
Tried calling, left voicemail. [place] for pec to get more details

## 2017-07-13 NOTE — Telephone Encounter (Signed)
Patient spoke to [facility] LPN

## 2017-07-14 ENCOUNTER — Telehealth: Payer: Self-pay | Admitting: Family Medicine

## 2017-07-14 ENCOUNTER — Ambulatory Visit: Payer: BLUE CROSS/BLUE SHIELD | Admitting: Internal Medicine

## 2017-07-14 NOTE — Telephone Encounter (Signed)
Pt called   Stating  She  Had  A  Message  On her voice   Earlier   -  She  Stated   That   She  Realized  It  Was  The  Office  Calling  Her about her  appt  tommorow     At [place]    She  Has  No  furthur  questians  And  Voices  Knowledge  Of plan of care

## 2017-07-15 ENCOUNTER — Encounter: Payer: Self-pay | Admitting: Internal Medicine

## 2017-07-15 ENCOUNTER — Ambulatory Visit (INDEPENDENT_AMBULATORY_CARE_PROVIDER_SITE_OTHER): Payer: BLUE CROSS/BLUE SHIELD | Admitting: Internal Medicine

## 2017-07-15 VITALS — BP 120/76 | HR 91 | Temp 98.3°F | Ht 64.0 in | Wt 234.8 lb

## 2017-07-15 DIAGNOSIS — E538 Deficiency of other specified B group vitamins: Secondary | ICD-10-CM | POA: Insufficient documentation

## 2017-07-15 DIAGNOSIS — E559 Vitamin D deficiency, unspecified: Secondary | ICD-10-CM | POA: Insufficient documentation

## 2017-07-15 DIAGNOSIS — R197 Diarrhea, unspecified: Secondary | ICD-10-CM

## 2017-07-15 DIAGNOSIS — K769 Liver disease, unspecified: Secondary | ICD-10-CM

## 2017-07-15 DIAGNOSIS — R911 Solitary pulmonary nodule: Secondary | ICD-10-CM

## 2017-07-15 DIAGNOSIS — I429 Cardiomyopathy, unspecified: Secondary | ICD-10-CM

## 2017-07-15 DIAGNOSIS — R0602 Shortness of breath: Secondary | ICD-10-CM

## 2017-07-15 NOTE — Telephone Encounter (Signed)
Patient schedule with dr. Aundra Dubin

## 2017-07-15 NOTE — Patient Instructions (Addendum)
I will refer you to [facility] for your shortness of breath  I will refer you for MRI abdomen Please f/u with OB/GYN about ovarian cyst right  Try Align probiotics and immodium AD if needed for diarrhea over the counter  F/u in 1 month    Diarrhea, Adult Diarrhea is when you have loose and water poop (stool) often. Diarrhea can make you feel weak and cause you to get dehydrated. Dehydration can make you tired and thirsty, make you have a dry mouth, and make it so you pee (urinate) less often. Diarrhea often lasts 2-3 days. However, it can last longer if it is a sign of something more serious. It is important to treat your diarrhea as told by your doctor. Follow these instructions at home: Eating and drinking  Follow these recommendations as told by your doctor:  Take an oral rehydration solution (ORS). This is a drink that is sold at pharmacies and stores.  Drink clear fluids, such as: ? Water. ? Ice chips. ? Diluted fruit juice. ? Low-calorie sports drinks.  Eat bland, easy-to-digest foods in small amounts as you are able. These foods include: ? Bananas. ? Applesauce. ? Rice. ? Low-fat (lean) meats. ? Toast. ? Crackers.  Avoid drinking fluids that have a lot of sugar or caffeine in them.  Avoid alcohol.  Avoid spicy or fatty foods.  General instructions   Drink enough fluid to keep your pee (urine) clear or pale yellow.  Wash your hands often. If you cannot use soap and water, use hand sanitizer.  Make sure that all people in your home wash their hands well and often.  Take over-the-counter and prescription medicines only as told by your doctor.  Rest at home while you get better.  Watch your condition for any changes.  Take a warm bath to help with any burning or pain from having diarrhea.  Keep all follow-up visits as told by your doctor. This is important. Contact a doctor if:  You have a fever.  Your diarrhea gets worse.  You have new  symptoms.  You cannot keep fluids down.  You feel light-headed or dizzy.  You have a headache.  You have muscle cramps. Get help right away if:  You have chest pain.  You feel very weak or you pass out (faint).  You have bloody or black poop or poop that look like tar.  You have very bad pain, cramping, or bloating in your belly (abdomen).  You have trouble breathing or you are breathing very quickly.  Your heart is beating very quickly.  Your skin feels cold and clammy.  You feel confused.  You have signs of dehydration, such as: ? Dark pee, hardly any pee, or no pee. ? Cracked lips. ? Dry mouth. ? Sunken eyes. ? Sleepiness. ? Weakness. This information is not intended to replace advice given to you by your health care provider. Make sure you discuss any questions you have with your health care provider. Document Released: 10/27/2007 Document Revised: 11/28/2015 Document Reviewed: 01/14/2015 Elsevier Interactive Patient Education  2018 [place] of Breath, Adult Shortness of breath means you have trouble breathing. Your lungs are organs for breathing. Follow these instructions at home: Pay attention to any changes in your symptoms. Take these actions to help with your condition:  Do not smoke. Smoking can cause shortness of breath. If you need help to quit smoking, ask your doctor.  Avoid things that can make it harder to breathe, such  as: ? Mold. ? Dust. ? Air pollution. ? Chemical smells. ? Things that can cause allergy symptoms (allergens), if you have allergies.  Keep your living space clean and free of mold and dust.  Rest as needed. Slowly return to your usual activities.  Take over-the-counter and prescription medicines, including oxygen and inhaled medicines, only as told by your doctor.  Keep all follow-up visits as told by your doctor. This is important.  Contact a doctor if:  Your condition does not get better as soon as  expected.  You have a hard time doing your normal activities, even after you rest.  You have new symptoms. Get help right away if:  You have trouble breathing when you are resting.  You feel light-headed or you faint.  You have a cough that is not helped by medicines.  You cough up blood.  You have pain with breathing.  You have pain in your chest, arms, shoulders, or belly (abdomen).  You have a fever.  You cannot walk up stairs.  You cannot exercise the way you normally do. This information is not intended to replace advice given to you by your health care provider. Make sure you discuss any questions you have with your health care provider. Document Released: 10/27/2007 Document Revised: 05/27/2016 Document Reviewed: 05/27/2016 Elsevier Interactive Patient Education  2017 [place].   Ovarian Cyst An ovarian cyst is a fluid-filled sac that forms on an ovary. The ovaries are small organs that produce eggs in women. Various types of cysts can form on the ovaries. Some may cause symptoms and require treatment. Most ovarian cysts go away on their own, are not cancerous (are benign), and do not cause problems. Common types of ovarian cysts include:  Functional (follicle) cysts. ? Occur during the menstrual cycle, and usually go away with the next menstrual cycle if you do not get pregnant. ? Usually cause no symptoms.  Endometriomas. ? Are cysts that form from the tissue that lines the uterus (endometrium). ? Are sometimes called "chocolate cysts" because they become filled with blood that turns brown. ? Can cause pain in the lower abdomen during intercourse and during your period.  Cystadenoma cysts. ? Develop from cells on the outside surface of the ovary. ? Can get very large and cause lower abdomen pain and pain with intercourse. ? Can cause severe pain if they twist or break open (rupture).  Dermoid cysts. ? Are sometimes found in both ovaries. ? May contain  different kinds of body tissue, such as skin, teeth, hair, or cartilage. ? Usually do not cause symptoms unless they get very big.  Theca lutein cysts. ? Occur when too much of a certain hormone (human chorionic gonadotropin) is produced and overstimulates the ovaries to produce an egg. ? Are most common after having procedures used to assist with the conception of a baby (in vitro fertilization).  What are the causes? Ovarian cysts may be caused by:  Ovarian hyperstimulation syndrome. This is a condition that can develop from taking fertility medicines. It causes multiple large ovarian cysts to form.  Polycystic ovarian syndrome (PCOS). This is a common hormonal disorder that can cause ovarian cysts, as well as problems with your period or fertility.  What increases the risk? The following factors may make you more likely to develop ovarian cysts:  Being overweight or obese.  Taking fertility medicines.  Taking certain forms of hormonal birth control.  Smoking.  What are the signs or symptoms? Many ovarian cysts do  not cause symptoms. If symptoms are present, they may include:  Pelvic pain or pressure.  Pain in the lower abdomen.  Pain during sex.  Abdominal swelling.  Abnormal menstrual periods.  Increasing pain with menstrual periods.  How is this diagnosed? These cysts are commonly found during a routine pelvic exam. You may have tests to find out more about the cyst, such as:  Ultrasound.  X-ray of the pelvis.  CT scan.  MRI.  Blood tests.  How is this treated? Many ovarian cysts go away on their own without treatment. Your health care provider may want to check your cyst regularly for 2-3 months to see if it changes. If you are in menopause, it is especially important to have your cyst monitored closely because menopausal women have a higher rate of ovarian cancer. When treatment is needed, it may include:  Medicines to help relieve pain.  A procedure  to drain the cyst (aspiration).  Surgery to remove the whole cyst.  Hormone treatment or birth control pills. These methods are sometimes used to help dissolve a cyst.  Follow these instructions at home:  Take over-the-counter and prescription medicines only as told by your health care provider.  Do not drive or use heavy machinery while taking prescription pain medicine.  Get regular pelvic exams and Pap tests as often as told by your health care provider.  Return to your normal activities as told by your health care provider. Ask your health care provider what activities are safe for you.  Do not use any products that contain nicotine or tobacco, such as cigarettes and e-cigarettes. If you need help quitting, ask your health care provider.  Keep all follow-up visits as told by your health care provider. This is important. Contact a health care provider if:  Your periods are late, irregular, or painful, or they stop.  You have pelvic pain that does not go away.  You have pressure on your bladder or trouble emptying your bladder completely.  You have pain during sex.  You have any of the following in your abdomen: ? A feeling of fullness. ? Pressure. ? Discomfort. ? Pain that does not go away. ? Swelling.  You feel generally ill.  You become constipated.  You lose your appetite.  You develop severe acne.  You start to have more body hair and facial hair.  You are gaining weight or losing weight without changing your exercise and eating habits.  You think you may be pregnant. Get help right away if:  You have abdominal pain that is severe or gets worse.  You cannot eat or drink without vomiting.  You suddenly develop a fever.  Your menstrual period is much heavier than usual. This information is not intended to replace advice given to you by your health care provider. Make sure you discuss any questions you have with your health care provider. Document  Released: 05/10/2005 Document Revised: 11/28/2015 Document Reviewed: 10/12/2015 Elsevier Interactive Patient Education  [employer].

## 2017-07-15 NOTE — Progress Notes (Signed)
Pre visit review using our clinic review tool, if applicable. No additional management support is needed unless otherwise documented below in the visit note. 

## 2017-07-19 ENCOUNTER — Encounter: Payer: Self-pay | Admitting: Internal Medicine

## 2017-07-19 DIAGNOSIS — R911 Solitary pulmonary nodule: Secondary | ICD-10-CM | POA: Insufficient documentation

## 2017-07-19 HISTORY — DX: Solitary pulmonary nodule: R91.1

## 2017-07-19 NOTE — Progress Notes (Signed)
Chief Complaint  Patient presents with  . Follow-up   Follow up  1. She went to another facility for sob found to have 1.9 liver lesion on CTA chest neg for PE and rec MRI abdomen will do. CT chest +0.2 cm right lower lobe nodule  2. Postpartum cardiomyopathy since 2006 follows with [place] cardiology Dr. Balinda Quails. Last EF in Ed 30-50% but last EF with cardiologist 45%  3. She still c/o sob with exertion and trouble with deep breathing she is bothered by aerosolized chemicals  4. C/o increased freq diarrhea and has FH of IBS. Denies ab pain or cramping. Disc diet choices and align probiotics and immodium otc      Review of Systems  Constitutional: Negative for weight loss.  HENT: Negative for hearing loss.   Eyes: Negative for blurred vision.  Respiratory: Positive for shortness of breath.   Cardiovascular: Negative for chest pain.  Gastrointestinal: Positive for diarrhea.  Skin: Negative for rash.  Neurological: Negative for headaches.  Psychiatric/Behavioral: Negative for memory loss.   Past Medical History:  Diagnosis Date  . Cardiomyopathy ([place])   . Chickenpox   . GERD (gastroesophageal reflux disease)   . Hypertension   . UTI (lower urinary tract infection)    Past Surgical History:  Procedure Laterality Date  . CHOLECYSTECTOMY  2003   Family History  Problem Relation Age of Onset  . Heart disease Mother   . Prostate cancer Father   . Alcoholism Unknown        Uncle  . Arthritis Unknown        Grandparent  . Colon cancer Other        Great grandparent, great uncle  . Uterine cancer Maternal Aunt   . Breast cancer Cousin   . Stroke Other        Great grandparent  . Hypertension Unknown        Parent  . Bone cancer Unknown        Uncle  . Healthy Daughter    Social History   Socioeconomic History  . Marital status: Married    Spouse name: Not on file  . Number of children: Not on file  . Years of education: Not on file  . Highest education level: Not  on file  Social Needs  . Financial resource strain: Not on file  . Food insecurity - worry: Not on file  . Food insecurity - inability: Not on file  . Transportation needs - medical: Not on file  . Transportation needs - non-medical: Not on file  Occupational History  . Not on file  Tobacco Use  . Smoking status: Never Smoker  . Smokeless tobacco: Never Used  Substance and Sexual Activity  . Alcohol use: Yes    Alcohol/week: 0.0 oz    Comment: 1 a month, rare  . Drug use: No  . Sexual activity: Not on file  Other Topics Concern  . Not on file  Social History Narrative   Lives with husband and 2 children in a one story home.     Works as a Database administrator.  Education: associates degree.   Current Meds  Medication Sig  . carvedilol (COREG CR) 40 MG 24 hr capsule Take by mouth.  . clindamycin (CLEOCIN T) 1 % lotion   . Esomeprazole Magnesium (NEXIUM PO) Take by mouth as needed.  . minocycline (MINOCIN,DYNACIN) 100 MG capsule Take 100 mg by mouth 2 (two) times daily.  . ramipril (ALTACE) 10 MG capsule   .  valACYclovir (VALTREX) 500 MG tablet Take by mouth.  . Vitamin D, Ergocalciferol, (DRISDOL) 50000 units CAPS capsule    No Known Allergies No results found for this or any previous visit (from the past 2160 hour(s)). Objective  Body mass index is 40.3 kg/m. Wt Readings from Last 3 Encounters:  07/15/17 234 lb 12.8 oz (106.5 kg)  10/10/15 234 lb 5 oz (106.3 kg)  08/23/15 227 lb (103 kg)   Temp Readings from Last 3 Encounters:  07/15/17 98.3 F (36.8 C) (Oral)  08/23/15 99.5 F (37.5 C) (Oral)  08/04/15 98.4 F (36.9 C) (Oral)   BP Readings from Last 3 Encounters:  07/15/17 120/76  10/10/15 118/74  08/23/15 100/60   Pulse Readings from Last 3 Encounters:  07/15/17 91  10/10/15 84  08/23/15 (!) 113   O2 sat 98% room air  Physical Exam  Constitutional: She is oriented to person, place, and time and well-developed, well-nourished, and in no distress. Vital  signs are normal.  HENT:  Head: Normocephalic and atraumatic.  Mouth/Throat: Oropharynx is clear and moist and mucous membranes are normal.  Eyes: Conjunctivae are normal. Pupils are equal, round, and reactive to light.  Cardiovascular: Normal rate, regular rhythm and normal heart sounds.  Pulmonary/Chest: Effort normal and breath sounds normal.  Neurological: She is alert and oriented to person, place, and time. Gait normal. Gait normal.  Skin: Skin is warm, dry and intact.  Psychiatric: Mood, memory, affect and judgment normal.  Nursing note and vitals reviewed.   Assessment   1. 1.9 liver lesion  2. 0.2 right lower lobe lung nodule  3. Sob with exertion with h/o postpartum cardiomyopathy. Sob also with deep inhalations and inhalations of aerosolized products r/o cardiac vs lung etiology CTA 06/21/16 neg PE  4. Diarrhea intermittently c/w IBS 5. HM Plan  1. MRI ab with and w/o contrast  2. Repeat CT chest in 1 year 3. [facility] to [facility]  4. FODMAP diet, prn immodium, align  5. Address at f/u  Declines flu shot  F/u OB/GYN right ovarian cyst      Provider: Dr. Olivia Mackie McLean-Scocuzza-[facility]

## 2017-07-25 ENCOUNTER — Ambulatory Visit
Admission: RE | Admit: 2017-07-25 | Discharge: 2017-07-25 | Disposition: A | Discharge status: Home / Self Care | Payer: BLUE CROSS/BLUE SHIELD | Source: Ambulatory Visit | Attending: Internal Medicine | Admitting: Internal Medicine

## 2017-07-25 DIAGNOSIS — K769 Liver disease, unspecified: Secondary | ICD-10-CM | POA: Insufficient documentation

## 2017-07-25 DIAGNOSIS — K760 Fatty (change of) liver, not elsewhere classified: Secondary | ICD-10-CM | POA: Diagnosis not present

## 2017-07-25 DIAGNOSIS — K7689 Other specified diseases of liver: Secondary | ICD-10-CM | POA: Diagnosis not present

## 2017-07-25 MED ORDER — GADOBENATE DIMEGLUMINE 529 MG/ML IV SOLN
20.0000 mL | Freq: Once | INTRAVENOUS | Status: AC | PRN
  Administered 2017-07-25: 20 mL via INTRAVENOUS

## 2017-07-31 ENCOUNTER — Other Ambulatory Visit: Payer: Self-pay | Admitting: Internal Medicine

## 2017-07-31 DIAGNOSIS — K760 Fatty (change of) liver, not elsewhere classified: Secondary | ICD-10-CM

## 2017-07-31 DIAGNOSIS — K7689 Other specified diseases of liver: Secondary | ICD-10-CM

## 2017-08-03 NOTE — Progress Notes (Signed)
Referral was sent out on 08/01/17.

## 2017-08-08 DIAGNOSIS — J310 Chronic rhinitis: Secondary | ICD-10-CM | POA: Diagnosis not present

## 2017-08-08 DIAGNOSIS — R0609 Other forms of dyspnea: Secondary | ICD-10-CM | POA: Diagnosis not present

## 2017-08-08 DIAGNOSIS — T7840XA Allergy, unspecified, initial encounter: Secondary | ICD-10-CM | POA: Diagnosis not present

## 2017-08-08 DIAGNOSIS — R0602 Shortness of breath: Secondary | ICD-10-CM | POA: Diagnosis not present

## 2017-08-19 ENCOUNTER — Ambulatory Visit (INDEPENDENT_AMBULATORY_CARE_PROVIDER_SITE_OTHER): Payer: BLUE CROSS/BLUE SHIELD

## 2017-08-19 ENCOUNTER — Ambulatory Visit (INDEPENDENT_AMBULATORY_CARE_PROVIDER_SITE_OTHER): Payer: BLUE CROSS/BLUE SHIELD | Admitting: Internal Medicine

## 2017-08-19 VITALS — BP 112/66 | HR 84 | Temp 98.6°F | Ht 64.0 in | Wt 232.8 lb

## 2017-08-19 DIAGNOSIS — M549 Dorsalgia, unspecified: Secondary | ICD-10-CM | POA: Diagnosis not present

## 2017-08-19 DIAGNOSIS — R0602 Shortness of breath: Secondary | ICD-10-CM

## 2017-08-19 DIAGNOSIS — M47814 Spondylosis without myelopathy or radiculopathy, thoracic region: Secondary | ICD-10-CM | POA: Diagnosis not present

## 2017-08-19 DIAGNOSIS — L732 Hidradenitis suppurativa: Secondary | ICD-10-CM

## 2017-08-19 DIAGNOSIS — K7689 Other specified diseases of liver: Secondary | ICD-10-CM

## 2017-08-19 DIAGNOSIS — K760 Fatty (change of) liver, not elsewhere classified: Secondary | ICD-10-CM

## 2017-08-19 MED ORDER — CLINDAMYCIN PHOSPHATE 1 % EX LOTN: TOPICAL_LOTION | Freq: Two times a day (BID) | CUTANEOUS | 11 refills | Status: AC

## 2017-08-19 NOTE — Patient Instructions (Addendum)
Please take Tylenol for pain max dose 3000 mg daily sparingly Ibuprofen  Consider physical therapy for back pain in the future  Please let me know if [facility] is not contacting you  Back Pain, Adult Many adults have back pain from time to time. Common causes of back pain include:  A strained muscle or ligament.  Wear and tear (degeneration) of the spinal disks.  Arthritis.  A hit to the back.  Back pain can be short-lived (acute) or last a long time (chronic). A physical exam, lab tests, and imaging studies may be done to find the cause of your pain. Follow these instructions at home: Managing pain and stiffness  Take over-the-counter and prescription medicines only as told by your health care provider.  If directed, apply heat to the affected area as often as told by your health care provider. Use the heat source that your health care provider recommends, such as a moist heat pack or a heating pad. ? Place a towel between your skin and the heat source. ? Leave the heat on for 20-30 minutes. ? Remove the heat if your skin turns bright red. This is especially important if you are unable to feel pain, heat, or cold. You have a greater risk of getting burned.  If directed, apply ice to the injured area: ? Put ice in a plastic bag. ? Place a towel between your skin and the bag. ? Leave the ice on for 20 minutes, 2-3 times a day for the first 2-3 days. Activity  Do not stay in bed. Resting more than 1-2 days can delay your recovery.  Take short walks on even surfaces as soon as you are able. Try to increase the length of time you walk each day.  Do not sit, drive, or stand in one place for more than 30 minutes at a time. Sitting or standing for long periods of time can put stress on your back.  Use proper lifting techniques. When you bend and lift, use positions that put less stress on your back: ? [place] your knees. ? Keep the load close to your body. ? Avoid twisting.  Exercise  regularly as told by your health care provider. Exercising will help your back heal faster. This also helps prevent back injuries by keeping muscles strong and flexible.  Your health care provider may recommend that you see a physical therapist. This person can help you come up with a safe exercise program. Do any exercises as told by your physical therapist. Lifestyle  Maintain a healthy weight. Extra weight puts stress on your back and makes it difficult to have good posture.  Avoid activities or situations that make you feel anxious or stressed. Learn ways to manage anxiety and stress. One way to manage stress is through exercise. Stress and anxiety increase muscle tension and can make back pain worse. General instructions  Sleep on a firm mattress in a comfortable position. Try lying on your side with your knees slightly bent. If you lie on your back, put a pillow under your knees.  Follow your treatment plan as told by your health care provider. This may include: ? Cognitive or behavioral therapy. ? Acupuncture or massage therapy. ? Meditation or yoga. Contact a health care provider if:  You have pain that is not relieved with rest or medicine.  You have increasing pain going down into your legs or buttocks.  Your pain does not improve in 2 weeks.  You have pain at night.  You lose weight.  You have a fever or chills. Get help right away if:  You develop new bowel or bladder control problems.  You have unusual weakness or numbness in your arms or legs.  You develop nausea or vomiting.  You develop abdominal pain.  You feel faint. Summary  Many adults have back pain from time to time. A physical exam, lab tests, and imaging studies may be done to find the cause of your pain.  Use proper lifting techniques. When you bend and lift, use positions that put less stress on your back.  Take over-the-counter and prescription medicines and apply heat or ice as directed by  your health care provider. This information is not intended to replace advice given to you by your health care provider. Make sure you discuss any questions you have with your health care provider. Document Released: 05/10/2005 Document Revised: 06/14/2016 Document Reviewed: 06/14/2016 Elsevier Interactive Patient Education  2018 [place].  Hidradenitis Suppurativa Hidradenitis suppurativa is a long-term (chronic) skin disease that starts with blocked sweat glands or hair follicles. Bacteria may grow in these blocked openings of your skin. Hidradenitis suppurativa is like a severe form of acne that develops in areas of your body where acne would be unusual. It is most likely to affect the areas of your body where skin rubs against skin and becomes moist. This includes your:  Underarms.  Groin.  Genital areas.  Buttocks.  Upper thighs.  Breasts.  Hidradenitis suppurativa may start out with small pimples. The pimples can develop into deep sores that break open (rupture) and drain pus. Over time your skin may thicken and become scarred. Hidradenitis suppurativa cannot be passed from person to person. What are the causes? The exact cause of hidradenitis suppurativa is not known. This condition may be due to:  Female and female hormones. The condition is rare before and after puberty.  An overactive body defense system (immune system). Your immune system may overreact to the blocked hair follicles or sweat glands and cause swelling and pus-filled sores.  What increases the risk? You may have a higher risk of hidradenitis suppurativa if you:  Are a woman.  Are between ages 63 and 72.  Have a family history of hidradenitis suppurativa.  Have a personal history of acne.  Are overweight.  Smoke.  Take the drug lithium.  What are the signs or symptoms? The first signs of an outbreak are usually painful skin bumps that look like pimples. As the condition progresses:  Skin  bumps may get bigger and grow deeper into the skin.  Bumps under the skin may rupture and drain smelly pus.  Skin may become itchy and infected.  Skin may thicken and scar.  Drainage may continue through tunnels under the skin (fistulas).  Walking and moving your arms can become painful.  How is this diagnosed? Your health care provider may diagnose hidradenitis suppurativa based on your medical history and your signs and symptoms. A physical exam will also be done. You may need to see a health care provider who specializes in skin diseases (dermatologist). You may also have tests done to confirm the diagnosis. These can include:  Swabbing a sample of pus or drainage from your skin so it can be sent to the lab and tested for infection.  Blood tests to check for infection.  How is this treated? The same treatment will not work for everybody with hidradenitis suppurativa. Your treatment will depend on how severe your symptoms are. You  may need to try several treatments to find what works best for you. Part of your treatment may include cleaning and bandaging (dressing) your wounds. You may also have to take medicines, such as the following:  Antibiotics.  Acne medicines.  Medicines to block or suppress the immune system.  A diabetes medicine (metformin) is sometimes used to treat this condition.  For women, birth control pills can sometimes help relieve symptoms.  You may need surgery if you have a severe case of hidradenitis suppurativa that does not respond to medicine. Surgery may involve:  Using a laser to clear the skin and remove hair follicles.  Opening and draining deep sores.  Removing the areas of skin that are diseased and scarred.  Follow these instructions at home:  Learn as much as you can about your disease, and work closely with your health care providers.  Take medicines only as directed by your health care provider.  If you were prescribed an antibiotic  medicine, finish it all even if you start to feel better.  If you are overweight, losing weight may be very helpful. Try to reach and maintain a healthy weight.  Do not use any tobacco products, including cigarettes, chewing tobacco, or electronic cigarettes. If you need help quitting, ask your health care provider.  Do not shave the areas where you get hidradenitis suppurativa.  Do not wear deodorant.  Wear loose-fitting clothes.  Try not to overheat and get sweaty.  Take a daily bleach bath as directed by your health care provider. ? Fill your bathtub halfway with water. ? Pour in  cup of unscented household bleach. ? Soak for 5-10 minutes.  Cover sore areas with a warm, clean washcloth (compress) for 5-10 minutes. Contact a health care provider if:  You have a flare-up of hidradenitis suppurativa.  You have chills or a fever.  You are having trouble controlling your symptoms at home. This information is not intended to replace advice given to you by your health care provider. Make sure you discuss any questions you have with your health care provider. Document Released: 12/23/2003 Document Revised: 10/16/2015 Document Reviewed: 08/10/2013 Elsevier Interactive Patient Education  2018 [place].  Fatty Liver Fatty liver, also called hepatic steatosis or steatohepatitis, is a condition in which too much fat has built up in your liver cells. The liver removes harmful substances from your bloodstream. It produces fluids your body needs. It also helps your body use and store energy from the food you eat. In many cases, fatty liver does not cause symptoms or problems. It is often diagnosed when tests are being done for other reasons. However, over time, fatty liver can cause inflammation that may lead to more serious liver problems, such as scarring of the liver (cirrhosis). What are the causes? Causes of fatty liver may include:  Drinking too much alcohol.  Poor  nutrition.  Obesity.  Cushing syndrome.  Diabetes.  Hyperlipidemia.  Pregnancy.  Certain drugs.  Poisons.  Some viral infections.  What increases the risk? You may be more likely to develop fatty liver if you:  Abuse alcohol.  Are pregnant.  Are overweight.  Have diabetes.  Have hepatitis.  Have a high triglyceride level.  What are the signs or symptoms? Fatty liver often does not cause any symptoms. In cases where symptoms develop, they can include:  Fatigue.  Weakness.  Weight loss.  Confusion.  Abdominal pain.  Yellowing of your skin and the white parts of your eyes (jaundice).  Nausea  and vomiting.  How is this diagnosed? Fatty liver may be diagnosed by:  Physical exam and medical history.  Blood tests.  Imaging tests, such as an ultrasound, CT scan, or MRI.  Liver biopsy. A small sample of liver tissue is removed using a needle. The sample is then looked at under a microscope.  How is this treated? Fatty liver is often caused by other health conditions. Treatment for fatty liver may involve medicines and lifestyle changes to manage conditions such as:  Alcoholism.  High cholesterol.  Diabetes.  Being overweight or obese.  Follow these instructions at home:  Eat a healthy diet as directed by your health care provider.  Exercise regularly. This can help you lose weight and control your cholesterol and diabetes. Talk to your health care provider about an exercise plan and which activities are best for you.  Do not drink alcohol.  Take medicines only as directed by your health care provider. Contact a health care provider if: You have difficulty controlling your:  Blood sugar.  Cholesterol.  Alcohol consumption.  Get help right away if:  You have abdominal pain.  You have jaundice.  You have nausea and vomiting. This information is not intended to replace advice given to you by your health care provider. Make sure you  discuss any questions you have with your health care provider. Document Released: 06/25/2005 Document Revised: 10/16/2015 Document Reviewed: 09/19/2013 Elsevier Interactive Patient Education  [employer].

## 2017-08-19 NOTE — Progress Notes (Signed)
Pre visit review using our clinic review tool, if applicable. No additional management support is needed unless otherwise documented below in the visit note. 

## 2017-08-22 ENCOUNTER — Other Ambulatory Visit: Payer: Self-pay | Admitting: Internal Medicine

## 2017-08-22 DIAGNOSIS — M47814 Spondylosis without myelopathy or radiculopathy, thoracic region: Secondary | ICD-10-CM

## 2017-08-22 DIAGNOSIS — M549 Dorsalgia, unspecified: Secondary | ICD-10-CM

## 2017-08-23 ENCOUNTER — Encounter: Payer: Self-pay | Admitting: Internal Medicine

## 2017-08-23 DIAGNOSIS — K760 Fatty (change of) liver, not elsewhere classified: Secondary | ICD-10-CM | POA: Insufficient documentation

## 2017-08-23 DIAGNOSIS — K7689 Other specified diseases of liver: Secondary | ICD-10-CM | POA: Insufficient documentation

## 2017-08-23 DIAGNOSIS — M549 Dorsalgia, unspecified: Secondary | ICD-10-CM | POA: Insufficient documentation

## 2017-08-23 NOTE — Progress Notes (Addendum)
Chief Complaint  Patient presents with  . Follow-up   F/u  1. SOB she saw [facility] and pfts sch 08/2017 they rec Flonase and Astelin and avoidance of chemical irritant triggers and she has upcoming f/u 2. Right axilla c/w hidradenitis lesion leaking yellow pus exercise per pt limited due to these lesions will come up with too much friction. Not using Cleotin currently needs refill 3. Left hand painful red lesion x 3 weeks noticed when she was clapping her hands in church. Lesion is hard. She is established with dermatology advised pt if not better rec f/u 4. Mid back pain new feels like a pinch in back coughing makes worse. Present x 1 month will Xray today  5. Reviewed MRI ab +focal nodular hyperplasia and fatty liver prev referred to [facility] pt has not heard about appt    Review of Systems  Constitutional: Negative for weight loss.  HENT: Negative for hearing loss.   Eyes: Negative for blurred vision.  Respiratory: Positive for shortness of breath.        SOB with exertion   Cardiovascular: Negative for chest pain.  Musculoskeletal: Positive for back pain.  Skin:       +skin lesions  Psychiatric/Behavioral: Negative for depression.   Past Medical History:  Diagnosis Date  . Cardiomyopathy ([place])   . Chickenpox   . GERD (gastroesophageal reflux disease)   . Hypertension   . UTI (lower urinary tract infection)    Past Surgical History:  Procedure Laterality Date  . CHOLECYSTECTOMY  2003   Family History  Problem Relation Age of Onset  . Heart disease Mother   . Prostate cancer Father   . Alcoholism Unknown        Uncle  . Arthritis Unknown        Grandparent  . Colon cancer Other        Great grandparent, great uncle  . Uterine cancer Maternal Aunt   . Breast cancer Cousin   . Stroke Other        Great grandparent  . Hypertension Unknown        Parent  . Bone cancer Unknown        Uncle  . Healthy Daughter    Social History   Socioeconomic History  . Marital  status: Married    Spouse name: Not on file  . Number of children: Not on file  . Years of education: Not on file  . Highest education level: Not on file  Occupational History  . Not on file  Social Needs  . Financial resource strain: Not on file  . Food insecurity:    Worry: Not on file    Inability: Not on file  . Transportation needs:    Medical: Not on file    Non-medical: Not on file  Tobacco Use  . Smoking status: Never Smoker  . Smokeless tobacco: Never Used  Substance and Sexual Activity  . Alcohol use: Yes    Alcohol/week: 0.0 oz    Comment: 1 a month, rare  . Drug use: No  . Sexual activity: Not on file  Lifestyle  . Physical activity:    Days per week: Not on file    Minutes per session: Not on file  . Stress: Not on file  Relationships  . Social connections:    Talks on phone: Not on file    Gets together: Not on file    Attends religious service: Not on file  Active member of club or organization: Not on file    Attends meetings of clubs or organizations: Not on file    Relationship status: Not on file  . Intimate partner violence:    Fear of current or ex partner: Not on file    Emotionally abused: Not on file    Physically abused: Not on file    Forced sexual activity: Not on file  Other Topics Concern  . Not on file  Social History Narrative   Lives with husband and 2 children in a one story home.     Works as a Database administrator.  Education: associates degree.   Current Meds  Medication Sig  . carvedilol (COREG CR) 40 MG 24 hr capsule Take by mouth.  . clindamycin (CLEOCIN T) 1 % lotion Apply topically 2 (two) times daily. Bid under arms  . Esomeprazole Magnesium (NEXIUM PO) Take by mouth as needed.  . minocycline (MINOCIN,DYNACIN) 100 MG capsule Take 100 mg by mouth 2 (two) times daily.  . ramipril (ALTACE) 10 MG capsule   . valACYclovir (VALTREX) 500 MG tablet Take by mouth.  . Vitamin D, Ergocalciferol, (DRISDOL) 50000 units CAPS capsule     . [DISCONTINUED] clindamycin (CLEOCIN T) 1 % lotion    No Known Allergies No results found for this or any previous visit (from the past 2160 hour(s)). Objective  Body mass index is 39.96 kg/m. Wt Readings from Last 3 Encounters:  08/19/17 232 lb 12.8 oz (105.6 kg)  07/15/17 234 lb 12.8 oz (106.5 kg)  10/10/15 234 lb 5 oz (106.3 kg)   Temp Readings from Last 3 Encounters:  08/19/17 98.6 F (37 C) (Oral)  07/15/17 98.3 F (36.8 C) (Oral)  08/23/15 99.5 F (37.5 C) (Oral)   BP Readings from Last 3 Encounters:  08/19/17 112/66  07/15/17 120/76  10/10/15 118/74   Pulse Readings from Last 3 Encounters:  08/19/17 84  07/15/17 91  10/10/15 84    Physical Exam  Constitutional: She is oriented to person, place, and time and well-developed, well-nourished, and in no distress. Vital signs are normal.  HENT:  Head: Normocephalic and atraumatic.  Mouth/Throat: Oropharynx is clear and moist and mucous membranes are normal.  Eyes: Pupils are equal, round, and reactive to light. Conjunctivae are normal.  Cardiovascular: Normal rate, regular rhythm and normal heart sounds.  Pulmonary/Chest: Effort normal and breath sounds normal.  Musculoskeletal:       Arms: Neurological: She is alert and oriented to person, place, and time. Gait normal. Gait normal.  Skin: Skin is warm and dry.     Psychiatric: Mood, memory, affect and judgment normal.  Talkative   Nursing note and vitals reviewed.   Assessment   1. SOB ? Etiology pulm vs cardiac vs body habitus  2. Hidradenitis suppurativa  3. Red skin lesion to left hand ? Etiology  4. Mid back pain  5. Focal nodular hyperplasia and fatty liver  6. HM Plan  1.  pfts sch 08/2017 [facility] also [facility] 2. Refill Cleocin  3. F/u derm if does not resolve 4. Xray today refer to PT, prn Tylenol  5. Re-refer again [facility]  Ed on Focal nodule hyperplasia  Will rec hep A/B vaccine in future if not had  No HLD 06/02/17  TC 133, TG 118, HDL 28.5, LDL 81, no DM cbg 81 06/16/17  Needs to exercise to lose wt exercise limited due to #2 per pt  6.  Declines flu  shot  Ask Tdap at f/u  OB/GYN appt sch for f/u ovarian cyst pap 06/02/17 neg pap neg HPV mammo 06/21/17 neg  Vit D 06/16/17 15.2 on 50K (see labs care everywhere) Ask if wants HIV testing in future    Reviewed [facility] note 09/05/17  Mild persistent asthma start Incruse 1 puff qd rec wt loss consider sleep study   Provider: Dr. Olivia Mackie McLean-Scocuzza-[facility]

## 2017-09-05 DIAGNOSIS — E669 Obesity, unspecified: Secondary | ICD-10-CM | POA: Diagnosis not present

## 2017-09-05 DIAGNOSIS — R0609 Other forms of dyspnea: Secondary | ICD-10-CM | POA: Diagnosis not present

## 2017-09-05 DIAGNOSIS — J4530 Mild persistent asthma, uncomplicated: Secondary | ICD-10-CM | POA: Diagnosis not present

## 2017-09-05 DIAGNOSIS — R0602 Shortness of breath: Secondary | ICD-10-CM | POA: Diagnosis not present

## 2017-09-12 DIAGNOSIS — M546 Pain in thoracic spine: Secondary | ICD-10-CM | POA: Diagnosis not present

## 2017-10-18 DIAGNOSIS — M546 Pain in thoracic spine: Secondary | ICD-10-CM | POA: Diagnosis not present

## 2017-10-18 DIAGNOSIS — E538 Deficiency of other specified B group vitamins: Secondary | ICD-10-CM | POA: Diagnosis not present

## 2017-10-21 ENCOUNTER — Ambulatory Visit (INDEPENDENT_AMBULATORY_CARE_PROVIDER_SITE_OTHER): Payer: BLUE CROSS/BLUE SHIELD | Admitting: Internal Medicine

## 2017-10-21 ENCOUNTER — Encounter: Payer: Self-pay | Admitting: Internal Medicine

## 2017-10-21 VITALS — BP 132/84 | HR 76 | Temp 98.7°F | Ht 64.0 in | Wt 233.4 lb

## 2017-10-21 DIAGNOSIS — K7689 Other specified diseases of liver: Secondary | ICD-10-CM

## 2017-10-21 DIAGNOSIS — Z1159 Encounter for screening for other viral diseases: Secondary | ICD-10-CM

## 2017-10-21 DIAGNOSIS — B353 Tinea pedis: Secondary | ICD-10-CM | POA: Diagnosis not present

## 2017-10-21 DIAGNOSIS — K760 Fatty (change of) liver, not elsewhere classified: Secondary | ICD-10-CM | POA: Diagnosis not present

## 2017-10-21 DIAGNOSIS — Z0184 Encounter for antibody response examination: Secondary | ICD-10-CM | POA: Diagnosis not present

## 2017-10-21 DIAGNOSIS — E559 Vitamin D deficiency, unspecified: Secondary | ICD-10-CM | POA: Diagnosis not present

## 2017-10-21 DIAGNOSIS — J45909 Unspecified asthma, uncomplicated: Secondary | ICD-10-CM

## 2017-10-21 DIAGNOSIS — N62 Hypertrophy of breast: Secondary | ICD-10-CM | POA: Diagnosis not present

## 2017-10-21 DIAGNOSIS — L732 Hidradenitis suppurativa: Secondary | ICD-10-CM | POA: Diagnosis not present

## 2017-10-21 DIAGNOSIS — J4520 Mild intermittent asthma, uncomplicated: Secondary | ICD-10-CM | POA: Diagnosis not present

## 2017-10-21 DIAGNOSIS — M549 Dorsalgia, unspecified: Secondary | ICD-10-CM | POA: Diagnosis not present

## 2017-10-21 DIAGNOSIS — B351 Tinea unguium: Secondary | ICD-10-CM

## 2017-10-21 HISTORY — DX: Unspecified asthma, uncomplicated: J45.909

## 2017-10-21 NOTE — Patient Instructions (Addendum)
Ask about Albuterol inhaler before exercise with your lung doctor  Think about hepatitis A/B vaccine  Dr. Hervey Ard -not sure if does breast reduction [phone]  Also I like Dr. Lyndee Leo Sanger [place] [phone]  Take care    Exercise-Induced Bronchospasm Exercise-induced bronchospasm (EIB) happens when the airways narrow during or after exercise. The airways are the passages that lead from the nose and mouth down into the lungs. When the airways narrow, this can cause coughing, wheezing, and shortness of breath. Anyone can develop this condition, even those who do not have asthma or allergies. To help prevent episodes of EIB, you may need to take medicine or change your workout routine. You should tell your coach, teammates, or workout partners about your condition so they know how to help you if you do have an episode. What are the causes? The exact cause of this condition is not known. Symptoms are brought on (triggered) by physical activity. EIB can also be triggered by dry air or by allergens and irritants, such as the chemicals used in pools and skating rinks. What increases the risk? The following factors may make you more likely to develop this condition:  Having asthma.  Exercising in dry air.  Exercising outdoors during allergy season.  Playing an outdoor sport that requires continuous motion. This includes sports such as soccer, hockey, and cross-country skiing.  What are the signs or symptoms? Symptoms of this condition include:  Wheezing.  Coughing.  Shortness of breath.  Tightness in the chest.  Sore throat.  Upset stomach.  How is this diagnosed? This condition may be diagnosed based on your symptoms, your medical history, and a physical exam. A test may be done to measure how exercise affects your breathing (spirometry test). For this test, you breathe into a device before and after exercising. How is this treated? Treatment for  this condition may include:  Changing your exercise routine. You may have to: ? Spend a few minutes warming up before your workout. ? Exercise indoors when the air is dry or during allergy season.  Taking medicine. Your health care provider may prescribe: ? An inhaler for you to use before you exercise. ? Oral medicine to control allergies and asthma. ? Inhaled steroids.  Follow these instructions at home:  Take over-the-counter and prescription medicines only as told by your health care provider.  Keep all bronchospasm medicine with you during your workout.  Make changes in your workout as told by your health care provider.  Wear a medical ID bracelet. Tell your coach, trainer, or teammates about your condition.  If you are planning to exercise alone or in an isolated area, let someone know where you are going and when you will be back.  Keep all follow-up visits as told by your health care provider. This is important. How is this prevented?  Take medicines to prevent exercise-induced bronchospasm as told by your health care provider. Work with Architect to make changes to your workout as needed.  If dry air triggers exercise-induced bronchospasm: ? Exercise indoors during peak allergy season and on days that are dry or cold. ? Try to breathe in warm, moist air by wearing a scarf over your nose and mouth or breathing only through your nose. Contact a health care provider if:  You have coughing, wheezing, or shortness of breath that continues after treatment.  Your coughing wakes you up at night.  You have less endurance than you used to.  Get help right away if:  You cannot catch your breath.  You pass out. This information is not intended to replace advice given to you by your health care provider. Make sure you discuss any questions you have with your health care provider. Document Released: 05/10/2005 Document Revised: 05/29/2015 Document Reviewed:  01/15/2015 Elsevier Interactive Patient Education  2017 [place].  Hepatitis A; Hepatitis B Vaccine injection What is this medicine? HEPATITIS A VACCINE; HEPATITIS B VACCINE (hep uh TAHY tis A vak SEEN; hep uh TAHY tis B vak SEEN) is a vaccine to protect from an infection with the hepatitis A and B virus. This vaccine does not contain the live viruses. It will not cause a hepatitis infection. This medicine may be used for other purposes; ask your health care provider or pharmacist if you have questions. COMMON BRAND NAME(S): Twinrix What should I tell my health care provider before I take this medicine? They need to know if you have any of these conditions: -bleeding disorder -fever or infection -heart disease -immune system problems -an unusual or allergic reaction to hepatitis A or B vaccine, neomycin, yeast, thimerosal, other medicines, foods, dyes, or preservatives -pregnant or trying to get pregnant -breast-feeding How should I use this medicine? This vaccine is for injection into a muscle. It is given by a health care professional. A copy of Vaccine Information Statements will be given before each vaccination. Read this sheet carefully each time. The sheet may change frequently. Talk to your pediatrician regarding the use of this medicine in children. Special care may be needed. Overdosage: If you think you have taken too much of this medicine contact a poison control center or emergency room at once. NOTE: This medicine is only for you. Do not share this medicine with others. What if I miss a dose? It is important not to miss your dose. Call your doctor or health care professional if you are unable to keep an appointment. What may interact with this medicine? -medicines that suppress your immune function like adalimumab, anakinra, infliximab -medicines to treat cancer -steroid medicines like prednisone or cortisone This list may not describe all possible interactions. Give  your health care provider a list of all the medicines, herbs, non-prescription drugs, or dietary supplements you use. Also tell them if you smoke, drink alcohol, or use illegal drugs. Some items may interact with your medicine. What should I watch for while using this medicine? See your health care provider for all shots of this vaccine as directed. You must have 3 to 4 shots of this vaccine for protection from hepatitis A and B infection. Tell your doctor right away if you have any serious or unusual side effects after getting this vaccine. What side effects may I notice from receiving this medicine? Side effects that you should report to your doctor or health care professional as soon as possible: -allergic reactions like skin rash, itching or hives, swelling of the face, lips, or tongue -breathing problems -confused, irritated -fast, irregular heartbeat -flu-like syndrome -numb, tingling pain -seizures Side effects that usually do not require medical attention (report to your doctor or health care professional if they continue or are bothersome): -diarrhea -fever -headache -loss of appetite -muscle pain -nausea -pain, redness, swelling, or irritation at site where injected -tiredness This list may not describe all possible side effects. Call your doctor for medical advice about side effects. You may report side effects to FDA at 1-800-FDA-1088. Where should I keep my medicine? This drug is given in  a hospital or clinic and will not be stored at home. NOTE: This sheet is a summary. It may not cover all possible information. If you have questions about this medicine, talk to your doctor, pharmacist, or health care provider.  2018 Elsevier/Gold Standard (2007-09-22 15:21:37)  Fatty Liver Fatty liver, also called hepatic steatosis or steatohepatitis, is a condition in which too much fat has built up in your liver cells. The liver removes harmful substances from your bloodstream. It  produces fluids your body needs. It also helps your body use and store energy from the food you eat. In many cases, fatty liver does not cause symptoms or problems. It is often diagnosed when tests are being done for other reasons. However, over time, fatty liver can cause inflammation that may lead to more serious liver problems, such as scarring of the liver (cirrhosis). What are the causes? Causes of fatty liver may include:  Drinking too much alcohol.  Poor nutrition.  Obesity.  Cushing syndrome.  Diabetes.  Hyperlipidemia.  Pregnancy.  Certain drugs.  Poisons.  Some viral infections.  What increases the risk? You may be more likely to develop fatty liver if you:  Abuse alcohol.  Are pregnant.  Are overweight.  Have diabetes.  Have hepatitis.  Have a high triglyceride level.  What are the signs or symptoms? Fatty liver often does not cause any symptoms. In cases where symptoms develop, they can include:  Fatigue.  Weakness.  Weight loss.  Confusion.  Abdominal pain.  Yellowing of your skin and the white parts of your eyes (jaundice).  Nausea and vomiting.  How is this diagnosed? Fatty liver may be diagnosed by:  Physical exam and medical history.  Blood tests.  Imaging tests, such as an ultrasound, CT scan, or MRI.  Liver biopsy. A small sample of liver tissue is removed using a needle. The sample is then looked at under a microscope.  How is this treated? Fatty liver is often caused by other health conditions. Treatment for fatty liver may involve medicines and lifestyle changes to manage conditions such as:  Alcoholism.  High cholesterol.  Diabetes.  Being overweight or obese.  Follow these instructions at home:  Eat a healthy diet as directed by your health care provider.  Exercise regularly. This can help you lose weight and control your cholesterol and diabetes. Talk to your health care provider about an exercise plan and  which activities are best for you.  Do not drink alcohol.  Take medicines only as directed by your health care provider. Contact a health care provider if: You have difficulty controlling your:  Blood sugar.  Cholesterol.  Alcohol consumption.  Get help right away if:  You have abdominal pain.  You have jaundice.  You have nausea and vomiting. This information is not intended to replace advice given to you by your health care provider. Make sure you discuss any questions you have with your health care provider. Document Released: 06/25/2005 Document Revised: 10/16/2015 Document Reviewed: 09/19/2013 Elsevier Interactive Patient Education  [employer].

## 2017-10-21 NOTE — Progress Notes (Signed)
Pre visit review using our clinic review tool, if applicable. No additional management support is needed unless otherwise documented below in the visit note. 

## 2017-10-21 NOTE — Progress Notes (Signed)
Chief Complaint  Patient presents with  . Follow-up   F/u  1. Fatty liver and [place] appt [facility] 10/31/17  2. C/o t. Pedis and onychomycosis wants referral to podiatry  3. dx'ed with asthma pt reports worse with certain smells and with exercise on incruse inhaler helps but not ussing qd  4. Mid back pain had 1 session of PT and back pain improved Xray Mid back with OA changes mild and she has 2nd PT session this week exercises helping at home given by PT. [facility] to large breasts she is thinking about breast reduction due to back pain and also told by Dr. Jamal Collin in the past hidradenitis axilla rec breast reduction considering referral    Review of Systems  Constitutional: Negative for weight loss.  HENT: Negative for hearing loss.   Eyes: Negative for blurred vision.  Respiratory: Negative for shortness of breath.   Cardiovascular: Negative for chest pain.  Gastrointestinal: Negative for abdominal pain.  Musculoskeletal: Negative for back pain.  Skin:       +onychomycosis, t pedis   Psychiatric/Behavioral: Negative for depression.   Past Medical History:  Diagnosis Date  . Cardiomyopathy ([place])   . Chickenpox   . GERD (gastroesophageal reflux disease)   . Hypertension   . UTI (lower urinary tract infection)    Past Surgical History:  Procedure Laterality Date  . CHOLECYSTECTOMY  2003   Family History  Problem Relation Age of Onset  . Heart disease Mother   . Prostate cancer Father   . Alcoholism Unknown        Uncle  . Arthritis Unknown        Grandparent  . Colon cancer Other        Great grandparent, great uncle  . Uterine cancer Maternal Aunt   . Breast cancer Cousin   . Stroke Other        Great grandparent  . Hypertension Unknown        Parent  . Bone cancer Unknown        Uncle  . Healthy Daughter    Social History   Socioeconomic History  . Marital status: Married    Spouse name: Not on file  . Number of children: Not on file  . Years of education: Not on  file  . Highest education level: Not on file  Occupational History  . Not on file  Social Needs  . Financial resource strain: Not on file  . Food insecurity:    Worry: Not on file    Inability: Not on file  . Transportation needs:    Medical: Not on file    Non-medical: Not on file  Tobacco Use  . Smoking status: Never Smoker  . Smokeless tobacco: Never Used  Substance and Sexual Activity  . Alcohol use: Yes    Alcohol/week: 0.0 oz    Comment: 1 a month, rare  . Drug use: No  . Sexual activity: Not on file  Lifestyle  . Physical activity:    Days per week: Not on file    Minutes per session: Not on file  . Stress: Not on file  Relationships  . Social connections:    Talks on phone: Not on file    Gets together: Not on file    Attends religious service: Not on file    Active member of club or organization: Not on file    Attends meetings of clubs or organizations: Not on file    Relationship status:  Not on file  . Intimate partner violence:    Fear of current or ex partner: Not on file    Emotionally abused: Not on file    Physically abused: Not on file    Forced sexual activity: Not on file  Other Topics Concern  . Not on file  Social History Narrative   Lives with husband and 2 children in a one story home.     Works as a Database administrator.  Education: associates degree.   Current Meds  Medication Sig  . azelastine (ASTELIN) 0.1 % nasal spray Place into both nostrils 2 (two) times daily. Use in each nostril as directed  . carvedilol (COREG CR) 40 MG 24 hr capsule Take by mouth.  . clindamycin (CLEOCIN T) 1 % lotion Apply topically 2 (two) times daily. Bid under arms  . Esomeprazole Magnesium (NEXIUM PO) Take by mouth as needed.  . fluticasone (FLONASE) 50 MCG/ACT nasal spray Place into both nostrils daily.  . minocycline (MINOCIN,DYNACIN) 100 MG capsule Take 100 mg by mouth 2 (two) times daily.  . ramipril (ALTACE) 10 MG capsule   . umeclidinium bromide (INCRUSE  ELLIPTA) 62.5 MCG/INH AEPB Inhale 1 puff into the lungs daily.  . valACYclovir (VALTREX) 500 MG tablet Take by mouth.  . Vitamin D, Ergocalciferol, (DRISDOL) 50000 units CAPS capsule    No Known Allergies No results found for this or any previous visit (from the past 2160 hour(s)). Objective  Body mass index is 40.06 kg/m. Wt Readings from Last 3 Encounters:  10/21/17 233 lb 6.4 oz (105.9 kg)  08/19/17 232 lb 12.8 oz (105.6 kg)  07/15/17 234 lb 12.8 oz (106.5 kg)   Temp Readings from Last 3 Encounters:  10/21/17 98.7 F (37.1 C) (Oral)  08/19/17 98.6 F (37 C) (Oral)  07/15/17 98.3 F (36.8 C) (Oral)   BP Readings from Last 3 Encounters:  10/21/17 132/84  08/19/17 112/66  07/15/17 120/76   Pulse Readings from Last 3 Encounters:  10/21/17 76  08/19/17 84  07/15/17 91    Physical Exam  Constitutional: She is oriented to person, place, and time. Vital signs are normal. She appears well-developed and well-nourished. She is cooperative.  HENT:  Head: Normocephalic and atraumatic.  Mouth/Throat: Oropharynx is clear and moist and mucous membranes are normal.  Eyes: Pupils are equal, round, and reactive to light. Conjunctivae are normal.  Cardiovascular: Normal rate, regular rhythm and normal heart sounds.  Pulmonary/Chest: Effort normal and breath sounds normal.  Neurological: She is alert and oriented to person, place, and time. Gait normal.  Skin: Skin is warm, dry and intact.  t pedis and onychomycosis   Psychiatric: She has a normal mood and affect. Her speech is normal and behavior is normal. Judgment and thought content normal. Cognition and memory are normal.  Nursing note and vitals reviewed.   Assessment   1. Fatty liver, [facility] 2. t pedis and onychomycosis  3. Asthma worse with exercise and inhalents  4. Mid back pain  5. Large breasts  6. HM  Plan  1. F/u [facility] 10/31/17  Check Hep A/B status  2. Refer Dr. Samara Deist  3. incruse ask pulm about saba  albuterol  Consider sleep study  4. Cont pt better for now  5. Considering reduction given names Dr. Migdalia Dk [place] and [place]  6.  Declines flu shot  Ask Tdap at f/u  OB/GYN appt sch for f/u ovarian cyst pap 06/02/17 neg pap neg HPV mammo 06/21/17 neg  Vit D 06/16/17 15.2 on 50K (see labs care everywhere) Ask if wants HIV testing in future  Check hep B MMR status     Provider: Dr. Olivia Mackie McLean-Scocuzza-Internal Medicine

## 2017-10-24 LAB — HEPATITIS A ANTIBODY, IGM: Hep A IgM: NONREACTIVE

## 2017-10-24 LAB — HEPATITIS B SURFACE ANTIBODY, QUANTITATIVE

## 2017-10-24 LAB — MEASLES/MUMPS/RUBELLA IMMUNITY
RUBEOLA IGG: 178 [AU]/ml
Rubella: 7.89 index

## 2017-10-24 LAB — HEPATITIS A ANTIBODY, TOTAL: Hepatitis A AB,Total: NONREACTIVE

## 2017-10-28 DIAGNOSIS — M546 Pain in thoracic spine: Secondary | ICD-10-CM | POA: Diagnosis not present

## 2017-10-31 DIAGNOSIS — K760 Fatty (change of) liver, not elsewhere classified: Secondary | ICD-10-CM | POA: Diagnosis not present

## 2017-11-02 DIAGNOSIS — M546 Pain in thoracic spine: Secondary | ICD-10-CM | POA: Diagnosis not present

## 2017-11-04 DIAGNOSIS — M546 Pain in thoracic spine: Secondary | ICD-10-CM | POA: Diagnosis not present

## 2017-11-08 DIAGNOSIS — Z30431 Encounter for routine checking of intrauterine contraceptive device: Secondary | ICD-10-CM | POA: Diagnosis not present

## 2017-11-21 DIAGNOSIS — E538 Deficiency of other specified B group vitamins: Secondary | ICD-10-CM | POA: Diagnosis not present

## 2017-11-29 DIAGNOSIS — B351 Tinea unguium: Secondary | ICD-10-CM | POA: Diagnosis not present

## 2017-11-29 DIAGNOSIS — B353 Tinea pedis: Secondary | ICD-10-CM | POA: Diagnosis not present

## 2017-11-29 DIAGNOSIS — M2012 Hallux valgus (acquired), left foot: Secondary | ICD-10-CM | POA: Diagnosis not present

## 2017-12-05 DIAGNOSIS — R0609 Other forms of dyspnea: Secondary | ICD-10-CM | POA: Diagnosis not present

## 2017-12-05 DIAGNOSIS — J4530 Mild persistent asthma, uncomplicated: Secondary | ICD-10-CM | POA: Diagnosis not present

## 2017-12-06 DIAGNOSIS — E538 Deficiency of other specified B group vitamins: Secondary | ICD-10-CM | POA: Diagnosis not present

## 2017-12-06 DIAGNOSIS — E559 Vitamin D deficiency, unspecified: Secondary | ICD-10-CM | POA: Diagnosis not present

## 2018-03-14 DIAGNOSIS — O903 Peripartum cardiomyopathy: Secondary | ICD-10-CM | POA: Diagnosis not present

## 2018-03-14 DIAGNOSIS — I10 Essential (primary) hypertension: Secondary | ICD-10-CM | POA: Diagnosis not present

## 2018-03-14 DIAGNOSIS — R0609 Other forms of dyspnea: Secondary | ICD-10-CM | POA: Diagnosis not present

## 2018-03-31 DIAGNOSIS — R0609 Other forms of dyspnea: Secondary | ICD-10-CM | POA: Diagnosis not present

## 2018-03-31 DIAGNOSIS — O903 Peripartum cardiomyopathy: Secondary | ICD-10-CM | POA: Diagnosis not present

## 2018-04-10 DIAGNOSIS — J4520 Mild intermittent asthma, uncomplicated: Secondary | ICD-10-CM | POA: Diagnosis not present

## 2018-04-10 DIAGNOSIS — R0609 Other forms of dyspnea: Secondary | ICD-10-CM | POA: Diagnosis not present

## 2018-04-10 DIAGNOSIS — K219 Gastro-esophageal reflux disease without esophagitis: Secondary | ICD-10-CM | POA: Diagnosis not present

## 2018-04-24 ENCOUNTER — Ambulatory Visit: Payer: BLUE CROSS/BLUE SHIELD | Admitting: Internal Medicine

## 2018-04-25 ENCOUNTER — Encounter: Payer: Self-pay | Admitting: Internal Medicine

## 2018-04-25 ENCOUNTER — Ambulatory Visit (INDEPENDENT_AMBULATORY_CARE_PROVIDER_SITE_OTHER): Payer: BLUE CROSS/BLUE SHIELD | Admitting: Internal Medicine

## 2018-04-25 VITALS — BP 126/88 | HR 90 | Temp 99.2°F | Ht 64.0 in | Wt 235.6 lb

## 2018-04-25 DIAGNOSIS — J4520 Mild intermittent asthma, uncomplicated: Secondary | ICD-10-CM

## 2018-04-25 DIAGNOSIS — R911 Solitary pulmonary nodule: Secondary | ICD-10-CM

## 2018-04-25 DIAGNOSIS — K760 Fatty (change of) liver, not elsewhere classified: Secondary | ICD-10-CM | POA: Diagnosis not present

## 2018-04-25 DIAGNOSIS — E559 Vitamin D deficiency, unspecified: Secondary | ICD-10-CM | POA: Diagnosis not present

## 2018-04-25 DIAGNOSIS — K219 Gastro-esophageal reflux disease without esophagitis: Secondary | ICD-10-CM | POA: Diagnosis not present

## 2018-04-25 NOTE — Progress Notes (Signed)
Pre visit review using our clinic review tool, if applicable. No additional management support is needed unless otherwise documented below in the visit note. 

## 2018-04-25 NOTE — Progress Notes (Addendum)
Chief Complaint  Patient presents with  . Follow-up   F/u  1. GERD/reflux after exercise did nexium 14 day course and helped wanted to know other options other than changing diet  2. Asthma controlled off incruse for now has prn Albuterol inhaler at home [facility]/u [facility] though coreg leg to sob with exertion just had exercise stress test and pfts 03/2018   Review of Systems  Constitutional: Positive for weight loss.       Down 7 lbs trying    HENT: Negative for hearing loss.   Eyes: Negative for blurred vision.  Respiratory: Negative for shortness of breath.   Cardiovascular: Negative for chest pain.  Gastrointestinal: Positive for heartburn.  Musculoskeletal: Negative for joint pain.  Skin: Negative for rash.  Neurological: Negative for headaches.  Psychiatric/Behavioral: Negative for depression.   Past Medical History:  Diagnosis Date  . Cardiomyopathy ([place])   . Chickenpox   . GERD (gastroesophageal reflux disease)   . Hypertension   . UTI (lower urinary tract infection)    Past Surgical History:  Procedure Laterality Date  . CHOLECYSTECTOMY  2003   Family History  Problem Relation Age of Onset  . Heart disease Mother   . Prostate cancer Father   . Alcoholism Unknown        Uncle  . Arthritis Unknown        Grandparent  . Colon cancer Other        Great grandparent, great uncle  . Uterine cancer Maternal Aunt   . Breast cancer Cousin   . Stroke Other        Great grandparent  . Hypertension Unknown        Parent  . Bone cancer Unknown        Uncle  . Healthy Daughter    Social History   Socioeconomic History  . Marital status: Married    Spouse name: Not on file  . Number of children: Not on file  . Years of education: Not on file  . Highest education level: Not on file  Occupational History  . Not on file  Social Needs  . Financial resource strain: Not on file  . Food insecurity:    Worry: Not on file    Inability: Not on file   . Transportation needs:    Medical: Not on file    Non-medical: Not on file  Tobacco Use  . Smoking status: Never Smoker  . Smokeless tobacco: Never Used  Substance and Sexual Activity  . Alcohol use: Yes    Alcohol/week: 0.0 standard drinks    Comment: 1 a month, rare  . Drug use: No  . Sexual activity: Not on file  Lifestyle  . Physical activity:    Days per week: Not on file    Minutes per session: Not on file  . Stress: Not on file  Relationships  . Social connections:    Talks on phone: Not on file    Gets together: Not on file    Attends religious service: Not on file    Active member of club or organization: Not on file    Attends meetings of clubs or organizations: Not on file    Relationship status: Not on file  . Intimate partner violence:    Fear of current or ex partner: Not on file    Emotionally abused: Not on file    Physically abused: Not on file    Forced sexual activity: Not on  file  Other Topics Concern  . Not on file  Social History Narrative   Lives with husband and 2 children in a one story home.     Works as a Database administrator.  Education: associates degree.   Current Meds  Medication Sig  . clindamycin (CLEOCIN T) 1 % lotion Apply topically 2 (two) times daily. Bid under arms  . Esomeprazole Magnesium (NEXIUM PO) Take by mouth as needed.  . fluticasone (FLONASE) 50 MCG/ACT nasal spray Place into both nostrils daily.  . ramipril (ALTACE) 10 MG capsule   . valACYclovir (VALTREX) 500 MG tablet Take by mouth.  . Vitamin D, Ergocalciferol, (DRISDOL) 50000 units CAPS capsule   . [DISCONTINUED] azelastine (ASTELIN) 0.1 % nasal spray Place into both nostrils 2 (two) times daily. Use in each nostril as directed  . [DISCONTINUED] carvedilol (COREG CR) 40 MG 24 hr capsule Take by mouth.  . [DISCONTINUED] minocycline (MINOCIN,DYNACIN) 100 MG capsule Take 100 mg by mouth 2 (two) times daily.  . [DISCONTINUED] umeclidinium bromide (INCRUSE ELLIPTA) 62.5  MCG/INH AEPB Inhale 1 puff into the lungs daily.   No Known Allergies No results found for this or any previous visit (from the past 2160 hour(s)). Objective  Body mass index is 40.44 kg/m. Wt Readings from Last 3 Encounters:  04/25/18 235 lb 9.6 oz (106.9 kg)  10/21/17 233 lb 6.4 oz (105.9 kg)  08/19/17 232 lb 12.8 oz (105.6 kg)   Temp Readings from Last 3 Encounters:  04/25/18 99.2 F (37.3 C) (Oral)  10/21/17 98.7 F (37.1 C) (Oral)  08/19/17 98.6 F (37 C) (Oral)   BP Readings from Last 3 Encounters:  04/25/18 126/88  10/21/17 132/84  08/19/17 112/66   Pulse Readings from Last 3 Encounters:  04/25/18 90  10/21/17 76  08/19/17 84    Physical Exam  Constitutional: She is oriented to person, place, and time. Vital signs are normal. She appears well-developed and well-nourished. She is cooperative.  HENT:  Head: Normocephalic and atraumatic.  Mouth/Throat: Oropharynx is clear and moist and mucous membranes are normal.  Eyes: Pupils are equal, round, and reactive to light. Conjunctivae are normal.  Cardiovascular: Normal rate, regular rhythm and normal heart sounds.  Pulmonary/Chest: Effort normal and breath sounds normal.  Neurological: She is alert and oriented to person, place, and time. Gait normal.  Skin: Skin is warm, dry and intact.  Psychiatric: She has a normal mood and affect. Her speech is normal and behavior is normal. Judgment and thought content normal. Cognition and memory are normal.  Nursing note and vitals reviewed.   Assessment   1. GERD  2. Vit D def  3. Fatty liver  4. HM 5. Asthma controlled  Plan   1. Disc otc ppi and pepcid ac diet changes  Takes ppi prn  2. rec D3 5000 IU daily once 50K weekly is done  3. rec twinrix  4.  Declines flu shot  Tdap and twinrix disc today  mmr immune    OB/GYN appt sch for f/u ovarian cyst pap 06/02/17 neg pap neg HPV mammo 06/21/17 neg OB/GYN to order  Vit D 06/16/17 15.2 on 50K (see labs care  everywhere) Ask if wants HIV testing in future Fasting labs in 6 months  CT chest 05/2017 1.9 cm liver lesion 0.2 cm right lower lobe pulm nodule consider repeat CT chest in 1 year  5. Prn Albuterol inhaler f/u with pulmonary cards considering changing BB to Toprol xl 25 mg qd  03/19/18 Dr.  [place] cards appt peripartum cardiomyopathy echo 30-50% [facility] echo EF 45% rec repeat echo and RHC hold Coreg 40 CR if making lungs worse rec ramipril 10 mg qd recheck BMET and BNP normal no need repeat imaging  At 02/2018 office visit  Provider: Dr. Olivia Mackie McLean-Scocuzza-Internal Medicine

## 2018-04-25 NOTE — Patient Instructions (Addendum)
Toprol XL 25 mg daily  Hep A/B vaccine Twinrix -2 doses 2nd dose 6 months to 1 year after 1st dose   Consider Tdap vaccine   mammo due 06/21/2018  D3 5000 IU daily once weekly run out, over the counter   pepcid ac (over the counter) for reflux, see diet list below     DTaP Vaccine (Diphtheria, Tetanus, and Pertussis): What You Need to Know 1. Why get vaccinated? Diphtheria, tetanus, and pertussis are serious diseases caused by bacteria. Diphtheria and pertussis are spread from person to person. Tetanus enters the body through cuts or wounds. DIPHTHERIA causes a thick covering in the back of the throat.  It can lead to breathing problems, paralysis, heart failure, and even death.  TETANUS (Lockjaw) causes painful tightening of the muscles, usually all over the body.  It can lead to "locking" of the jaw so the victim cannot open his mouth or swallow. Tetanus leads to death in up to 2 out of 10 cases.  PERTUSSIS (Whooping Cough) causes coughing spells so bad that it is hard for infants to eat, drink, or breathe. These spells can last for weeks.  It can lead to pneumonia, seizures (jerking and staring spells), brain damage, and death.  Diphtheria, tetanus, and pertussis vaccine (DTaP) can help prevent these diseases. Most children who are vaccinated with DTaP will be protected throughout childhood. Many more children would get these diseases if we stopped vaccinating. DTaP is a safer version of an older vaccine called DTP. DTP is no longer used in the [place]. 2. Who should get DTaP vaccine and when? Children should get 5 doses of DTaP vaccine, one dose at each of the following ages:  2 months  4 months  6 months  15-18 months  4-6 years  DTaP may be given at the same time as other vaccines. 3. Some children should not get DTaP vaccine or should wait  Children with minor illnesses, such as a cold, may be vaccinated. But children who are moderately or severely ill should  usually wait until they recover before getting DTaP vaccine.  Any child who had a life-threatening allergic reaction after a dose of DTaP should not get another dose.  Any child who suffered a brain or nervous system disease within 7 days after a dose of DTaP should not get another dose.  Talk with your doctor if your child: ? had a seizure or collapsed after a dose of DTaP, ? cried non-stop for 3 hours or more after a dose of DTaP, ? had a fever over 105F after a dose of DTaP. Ask your doctor for more information. Some of these children should not get another dose of pertussis vaccine, but may get a vaccine without pertussis, called DT. 4. Older children and adults DTaP is not licensed for adolescents, adults, or children 43 years of age and older. But older people still need protection. A vaccine called Tdap is similar to DTaP. A single dose of Tdap is recommended for people 11 through 43 years of age. Another vaccine, called Td, protects against tetanus and diphtheria, but not pertussis. It is recommended every 10 years. There are separate Vaccine Information Statements for these vaccines. 5. What are the risks from DTaP vaccine? Getting diphtheria, tetanus, or pertussis disease is much riskier than getting DTaP vaccine. However, a vaccine, like any medicine, is capable of causing serious problems, such as severe allergic reactions. The risk of DTaP vaccine causing serious harm, or death, is extremely small.  Mild problems (common)  Fever (up to about 1 child in 4)  Redness or swelling where the shot was given (up to about 1 child in 4)  Soreness or tenderness where the shot was given (up to about 1 child in 4) These problems occur more often after the 4th and 5th doses of the DTaP series than after earlier doses. Sometimes the 4th or 5th dose of DTaP vaccine is followed by swelling of the entire arm or leg in which the shot was given, lasting 1-7 days (up to about 1 child in 59). Other  mild problems include:  Fussiness (up to about 1 child in 3)  Tiredness or poor appetite (up to about 1 child in 10)  Vomiting (up to about 1 child in 34) These problems generally occur 1-3 days after the shot. Moderate problems (uncommon)  Seizure (jerking or staring) (about 1 child out of 14,000)  Non-stop crying, for 3 hours or more (up to about 1 child out of 1,000)  High fever, over 105F (about 1 child out of 16,000) Severe problems (very rare)  Serious allergic reaction (less than 1 out of a million doses)  Several other severe problems have been reported after DTaP vaccine. These include: ? Long-term seizures, coma, or lowered consciousness ? Permanent brain damage. These are so rare it is hard to tell if they are caused by the vaccine. Controlling fever is especially important for children who have had seizures, for any reason. It is also important if another family member has had seizures. You can reduce fever and pain by giving your child an aspirin-free pain reliever when the shot is given, and for the next 24 hours, following the package instructions. 6. What if there is a serious reaction? What should I look for? Look for anything that concerns you, such as signs of a severe allergic reaction, very high fever, or behavior changes. Signs of a severe allergic reaction can include hives, swelling of the face and throat, difficulty breathing, a fast heartbeat, dizziness, and weakness. These would start a few minutes to a few hours after the vaccination. What should I do?  If you think it is a severe allergic reaction or other emergency that can't wait, call 9-1-1 or get the person to the nearest hospital. Otherwise, call your doctor.  Afterward, the reaction should be reported to the Vaccine Adverse Event Reporting System (VAERS). Your doctor might file this report, or you can do it yourself through the VAERS web site at www.vaers.SamedayNews.es, or by calling  321-499-8242. ? VAERS is only for reporting reactions. They do not give medical advice. 7. The National Vaccine Injury Compensation Program The [employer] Vaccine Injury Compensation Program (VICP) is a federal program that was created to compensate people who may have been injured by certain vaccines. Persons who believe they may have been injured by a vaccine can learn about the program and about filing a claim by calling 289-104-8297 or visiting the [place] website at GoldCloset.com.ee. 8. How can I learn more?  Ask your doctor.  Call your local or state health department.  Contact the Centers for Disease Control and Prevention (CDC): ? Call 339-391-5925 (1-800-CDC-INFO) or ? Visit CDC's website at http://hunter.com/ CDC DTaP Vaccine (Diphtheria, Tetanus, and Pertussis) VIS (10/07/05) This information is not intended to replace advice given to you by your health care provider. Make sure you discuss any questions you have with your health care provider. Document Released: 03/07/2006 Document Revised: 01/29/2016 Document Reviewed: 01/29/2016 Elsevier Interactive Patient  Education  2017 [place].   Hepatitis A; Hepatitis B Vaccine injection What is this medicine? HEPATITIS A VACCINE; HEPATITIS B VACCINE (hep uh TAHY tis A vak SEEN; hep uh TAHY tis B vak SEEN) is a vaccine to protect from an infection with the hepatitis A and B virus. This vaccine does not contain the live viruses. It will not cause a hepatitis infection. This medicine may be used for other purposes; ask your health care provider or pharmacist if you have questions. COMMON BRAND NAME(S): Twinrix What should I tell my health care provider before I take this medicine? They need to know if you have any of these conditions: -bleeding disorder -fever or infection -heart disease -immune system problems -an unusual or allergic reaction to hepatitis A or B vaccine, neomycin, yeast, thimerosal, other  medicines, foods, dyes, or preservatives -pregnant or trying to get pregnant -breast-feeding How should I use this medicine? This vaccine is for injection into a muscle. It is given by a health care professional. A copy of Vaccine Information Statements will be given before each vaccination. Read this sheet carefully each time. The sheet may change frequently. Talk to your pediatrician regarding the use of this medicine in children. Special care may be needed. Overdosage: If you think you have taken too much of this medicine contact a poison control center or emergency room at once. NOTE: This medicine is only for you. Do not share this medicine with others. What if I miss a dose? It is important not to miss your dose. Call your doctor or health care professional if you are unable to keep an appointment. What may interact with this medicine? -medicines that suppress your immune function like adalimumab, anakinra, infliximab -medicines to treat cancer -steroid medicines like prednisone or cortisone This list may not describe all possible interactions. Give your health care provider a list of all the medicines, herbs, non-prescription drugs, or dietary supplements you use. Also tell them if you smoke, drink alcohol, or use illegal drugs. Some items may interact with your medicine. What should I watch for while using this medicine? See your health care provider for all shots of this vaccine as directed. You must have 3 to 4 shots of this vaccine for protection from hepatitis A and B infection. Tell your doctor right away if you have any serious or unusual side effects after getting this vaccine. What side effects may I notice from receiving this medicine? Side effects that you should report to your doctor or health care professional as soon as possible: -allergic reactions like skin rash, itching or hives, swelling of the face, lips, or tongue -breathing problems -confused, irritated -fast,  irregular heartbeat -flu-like syndrome -numb, tingling pain -seizures Side effects that usually do not require medical attention (report to your doctor or health care professional if they continue or are bothersome): -diarrhea -fever -headache -loss of appetite -muscle pain -nausea -pain, redness, swelling, or irritation at site where injected -tiredness This list may not describe all possible side effects. Call your doctor for medical advice about side effects. You may report side effects to FDA at 1-800-FDA-1088. Where should I keep my medicine? This drug is given in a hospital or clinic and will not be stored at home. NOTE: This sheet is a summary. It may not cover all possible information. If you have questions about this medicine, talk to your doctor, pharmacist, or health care provider.  2018 Elsevier/Gold Standard (2007-09-22 15:21:37)   Food Choices for Gastroesophageal Reflux Disease, Adult  When you have gastroesophageal reflux disease (GERD), the foods you eat and your eating habits are very important. Choosing the right foods can help ease the discomfort of GERD. Consider working with a diet and nutrition specialist (dietitian) to help you make healthy food choices. What general guidelines should I follow? Eating plan  Choose healthy foods low in fat, such as fruits, vegetables, whole grains, low-fat dairy products, and lean meat, fish, and poultry.  Eat frequent, small meals instead of three large meals each day. Eat your meals slowly, in a relaxed setting. Avoid bending over or lying down until 2-3 hours after eating.  Limit high-fat foods such as fatty meats or fried foods.  Limit your intake of oils, butter, and shortening to less than 8 teaspoons each day.  Avoid the following: ? Foods that cause symptoms. These may be different for different people. Keep a food diary to keep track of foods that cause symptoms. ? Alcohol. ? Drinking large amounts of liquid with  meals. ? Eating meals during the 2-3 hours before bed.  Cook foods using methods other than frying. This may include baking, grilling, or broiling. Lifestyle   Maintain a healthy weight. Ask your health care provider what weight is healthy for you. If you need to lose weight, work with your health care provider to do so safely.  Exercise for at least 30 minutes on 5 or more days each week, or as told by your health care provider.  Avoid wearing clothes that fit tightly around your waist and chest.  Do not use any products that contain nicotine or tobacco, such as cigarettes and e-cigarettes. If you need help quitting, ask your health care provider.  Sleep with the head of your bed raised. Use a wedge under the mattress or blocks under the bed frame to raise the head of the bed. What foods are not recommended? The items listed may not be a complete list. Talk with your dietitian about what dietary choices are best for you. Grains Pastries or quick breads with added fat. [place] toast. Vegetables Deep fried vegetables. [place] fries. Any vegetables prepared with added fat. Any vegetables that cause symptoms. For some people this may include tomatoes and tomato products, chili peppers, onions and garlic, and horseradish. Fruits Any fruits prepared with added fat. Any fruits that cause symptoms. For some people this may include citrus fruits, such as oranges, grapefruit, pineapple, and lemons. Meats and other protein foods High-fat meats, such as fatty beef or pork, hot dogs, ribs, ham, sausage, salami and bacon. Fried meat or protein, including fried fish and fried chicken. Nuts and nut butters. Dairy Whole milk and chocolate milk. Sour cream. Cream. Ice cream. Cream cheese. Milk shakes. Beverages Coffee and tea, with or without caffeine. Carbonated beverages. Sodas. Energy drinks. Fruit juice made with acidic fruits (such as orange or grapefruit). Tomato juice. Alcoholic drinks. Fats and  oils Butter. Margarine. Shortening. Ghee. Sweets and desserts Chocolate and cocoa. Donuts. Seasoning and other foods Pepper. Peppermint and spearmint. Any condiments, herbs, or seasonings that cause symptoms. For some people, this may include curry, hot sauce, or vinegar-based salad dressings. Summary  When you have gastroesophageal reflux disease (GERD), food and lifestyle choices are very important to help ease the discomfort of GERD.  Eat frequent, small meals instead of three large meals each day. Eat your meals slowly, in a relaxed setting. Avoid bending over or lying down until 2-3 hours after eating.  Limit high-fat foods such as fatty meat or  fried foods. This information is not intended to replace advice given to you by your health care provider. Make sure you discuss any questions you have with your health care provider. Document Released: 05/10/2005 Document Revised: 05/11/2016 Document Reviewed: 05/11/2016 Elsevier Interactive Patient Education  [employer].

## 2018-04-26 IMAGING — MR MR ABDOMEN WO/W CM
12 of 17 series · 26 of 48 positions shown · IV contrast (20mL MULTIHANCE)
Comparison: 09/09/2009.

CLINICAL DATA: Evaluate indeterminate liver lesion seen on outside
imaging.

EXAM:
MRI ABDOMEN WITHOUT AND WITH CONTRAST
TECHNIQUE: Multiplanar multisequence MR imaging of the abdomen was performed
both before and after the administration of intravenous contrast.
CONTRAST:  20mL MULTIHANCE GADOBENATE DIMEGLUMINE 529 MG/ML IV SOLN

[Series 3: T2 · coronal · 8.0mm · 0.78mm/px · 1 of 26 slices shown (1 of 2)]
[im 1/26]
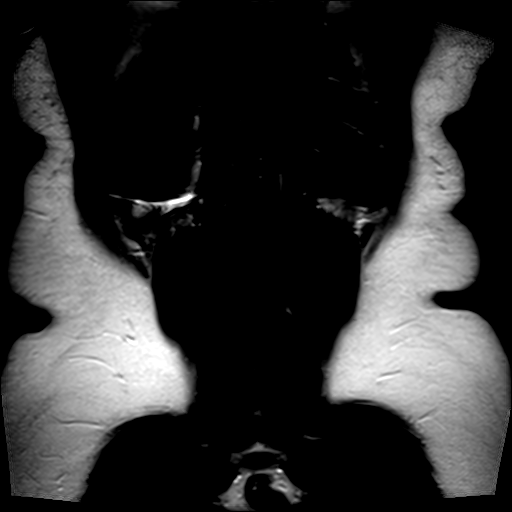

[Series 4: T2 fat-sat · axial · 8.0mm · 0.74mm/px · 1 of 25 slices shown]
[im 1/25]
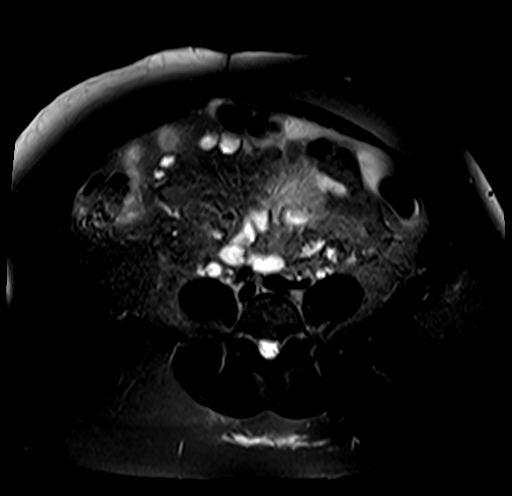

[Series 5: T2 · axial · 8.0mm · 0.74mm/px · 1 of 25 slices shown (2 of 2)]
[im 1/25]
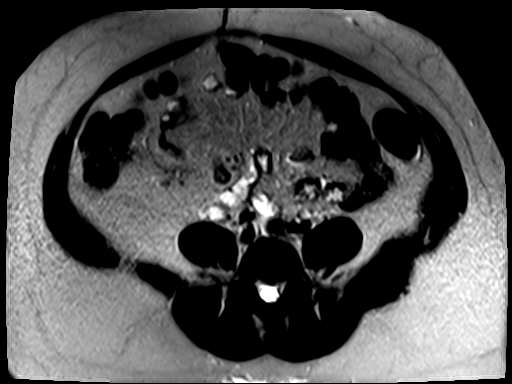

[Series 6: T1 · axial · 8.0mm · 0.74mm/px · 1 of 50 slices shown]
[im 1/50]
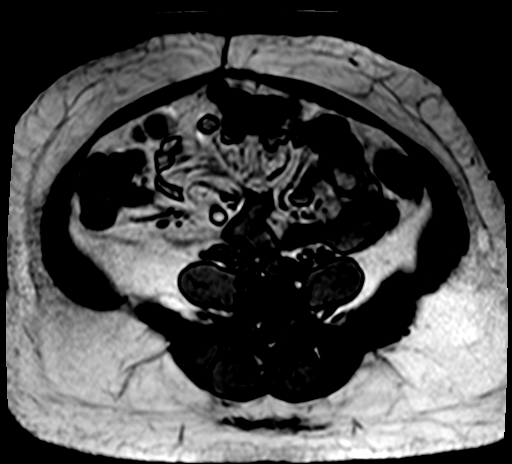

[Series 7: ax fisp · axial · 4.0mm · 0.74mm/px · 1 of 59 slices shown]
[im 1/59]
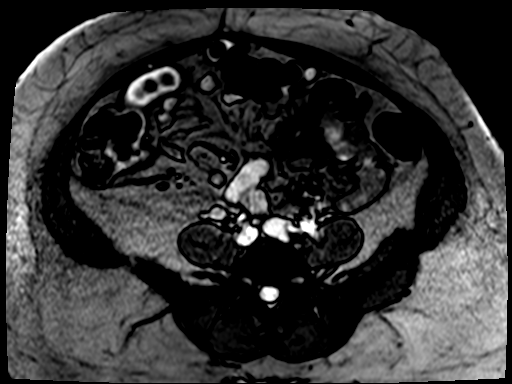

[Series 9: DWI · axial · 6.0mm · 1.98mm/px · z∈[-64,+195]mm · 3 of 111 slices shown]
[im 1/111]
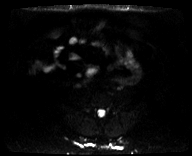
[im 56/111]
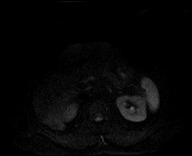
[im 111/111]
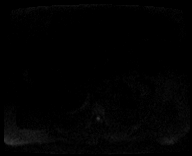

[Series 10: ax dwi_adc · axial · 6.0mm · 1.98mm/px · 1 of 37 slices shown]
[im 1/37]
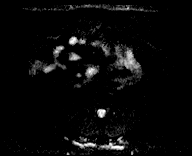

[Series 11: T1 dynamic fat-sat · axial · non-contrast · 2.5mm · 0.74mm/px · z∈[-75,+162]mm · 3 of 96 slices shown (1 of 4)]
[im 1/96]
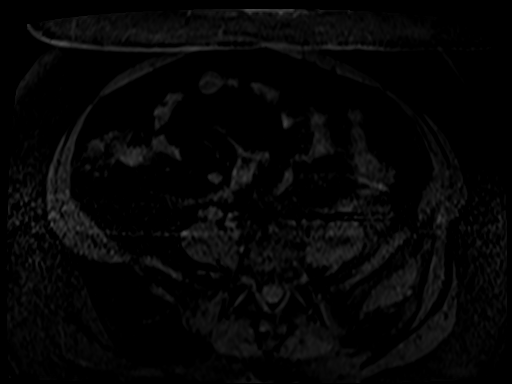
[im 48/96]
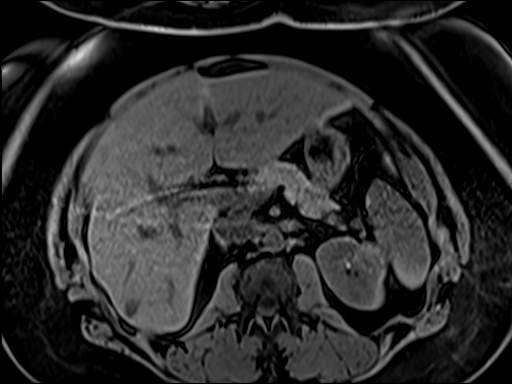
[im 96/96]
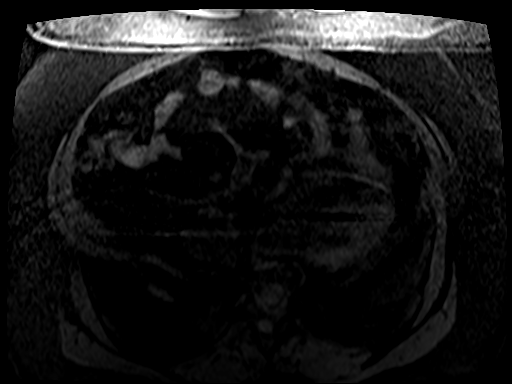

[Series 12: T1 dynamic fat-sat · axial · 2.5mm · 0.74mm/px · z∈[-75,+162]mm · 4 of 96 slices shown (2 of 4)]
[im 1/96]
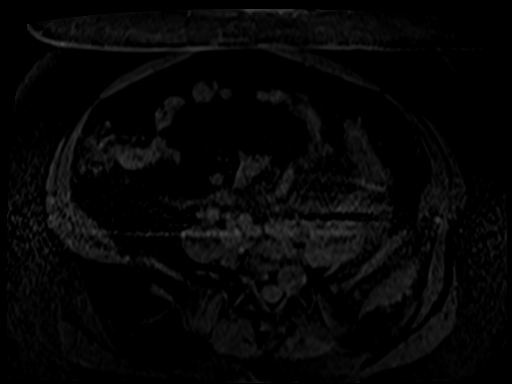
[im 32/96]
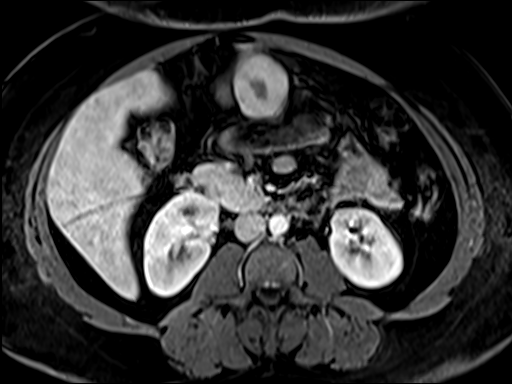
[im 64/96]
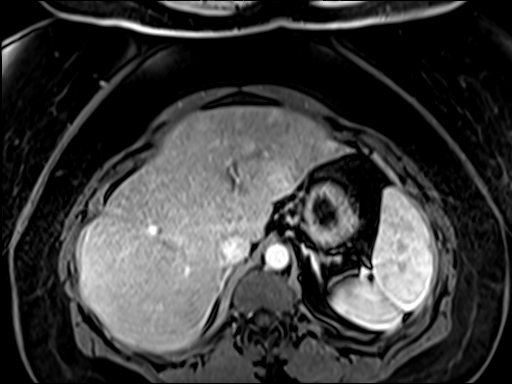
[im 96/96]
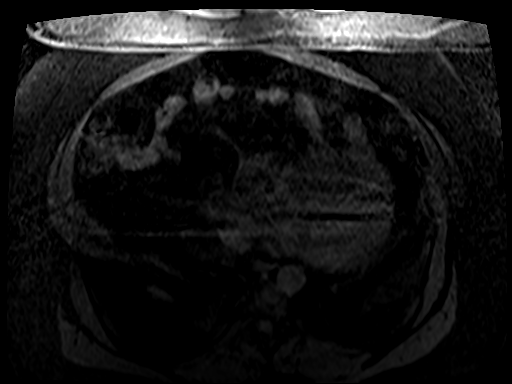

[Series 13: T1 dynamic fat-sat · axial · 2.5mm · 0.74mm/px · z∈[-75,+162]mm · 4 of 96 slices shown (3 of 4)]
[im 1/96]
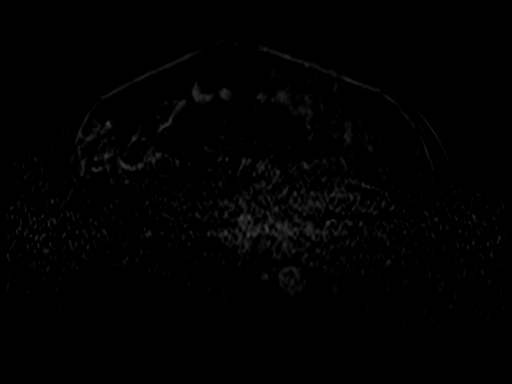
[im 32/96]
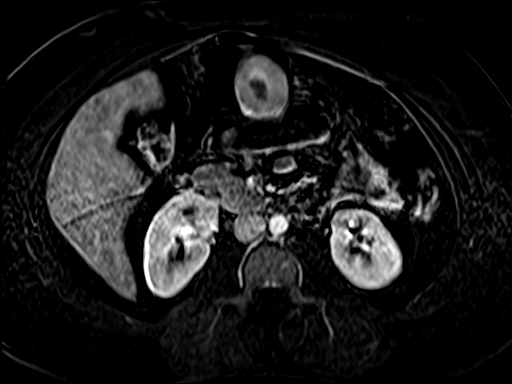
[im 64/96]
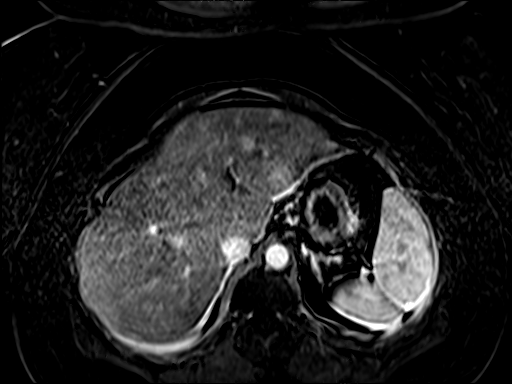
[im 96/96]
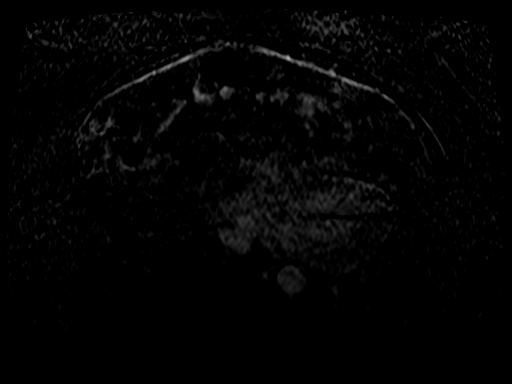

[Series 14: T1 dynamic fat-sat post-contrast · axial · 2.5mm · 0.74mm/px · z∈[-75,+162]mm · 4 of 96 slices shown]
[im 1/96]
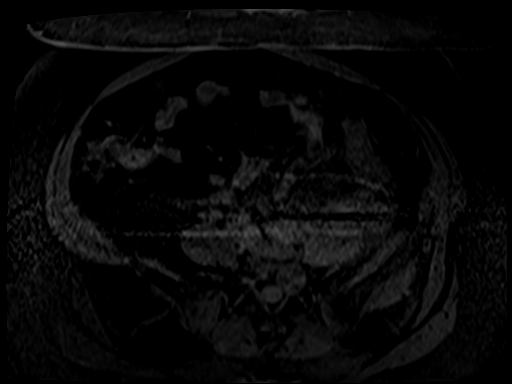
[im 32/96]
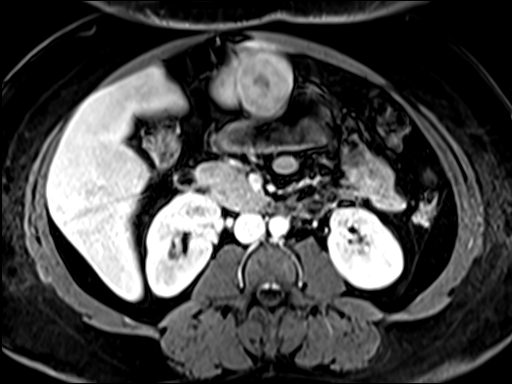
[im 64/96]
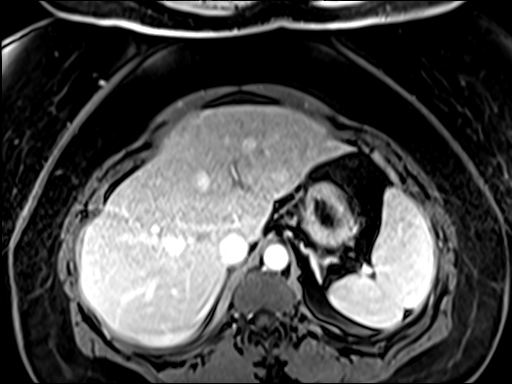
[im 96/96]
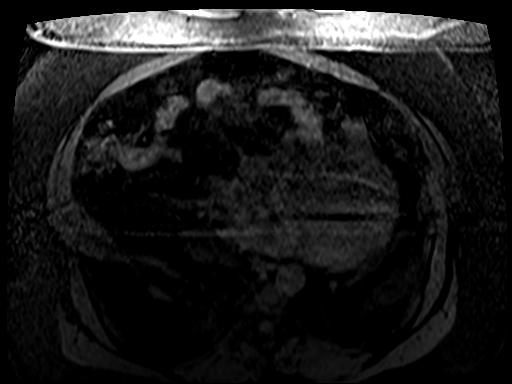

[Series 15: T1 dynamic fat-sat · axial · 2.5mm · 0.74mm/px · z∈[-75,+2]mm · 2 of 96 slices shown (4 of 4)]
[im 1/96]
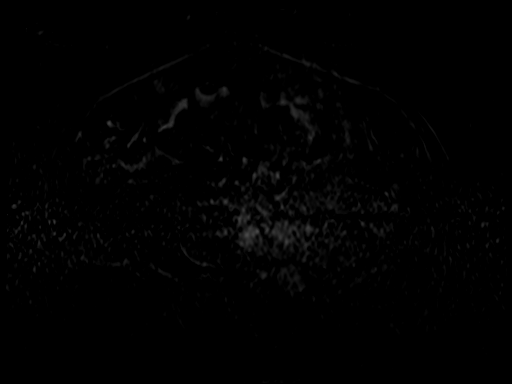
[im 32/96]
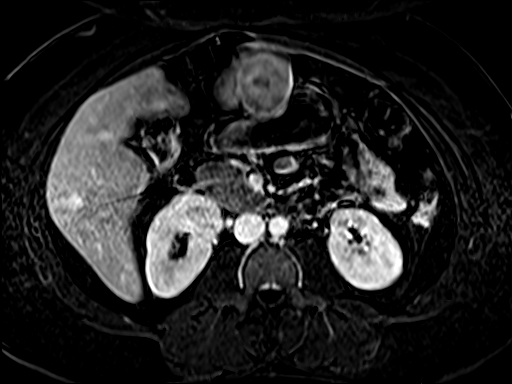

[26 of 48 positions shown; findings below may reference images not displayed]

FINDINGS: Lower chest: No pleural effusion.  No acute abnormality noted.

Hepatobiliary: Between the inphase and out of phase images there are
scattered areas of focal nodular signal dropout (on the out of phase
sequences). For example, in the right lobe of liver there is a
cm area of signal loss, image [DATE]. Similarly within the medial
segment of left lobe there is a 2 cm area of signal loss, image
[DATE]. On comparison study from 09/09/2009 there were scattered areas
of low-attenuation within both lobes of liver. Most of these are
favored to represent focal fatty deposition.

Following the IV administration of contrast there is a focal area of
arterial phase enhancement which measures 1.9 cm, image 59/12 which
corresponds to an area of signal loss on the out of phase sequences.
Similarly, within the posterior aspect of the lateral segment of
left lobe of liver there is a 1.6 cm area of arterial phase
enhancement with corresponding loss of signal on the out of phase
sequences.

Within the inferior aspect of the lateral segment of left lobe of
liver there is a well-circumscribed, partially exophytic rounded
mass which measures 4.8 cm, image 64/series 12. This exhibits
enhancement on the arterial phase images and becomes isointense to
liver on the delayed images. A central scar is identified suggesting
an area of focal nodular hyperplasia. This lesion was likely present
on the unenhanced study from 3166 when it measured 2.7 cm.

Previous cholecystectomy.  No biliary dilatation.

Pancreas:  Unremarkable appearance of the pancreas.

Spleen:  Spleen is unremarkable.

Adrenals/Urinary Tract: Normal appearance of the adrenal glands. No
kidney mass or hydronephrosis.

Stomach/Bowel: Visualized portions within the abdomen are
unremarkable.

Vascular/Lymphatic: No pathologically enlarged lymph nodes
identified. No abdominal aortic aneurysm demonstrated.

Other:  No free fluid or fluid collections identified.

Musculoskeletal: No suspicious bone lesions identified.
IMPRESSION: 1. Dominant mass within the lateral segment of left lobe of liver is
identified. Favor focal nodular hyperplasia. In retrospect this may
have been present on the study from 09/09/2009 but has increased in
size in the interval. Advise follow-up imaging in 6 months with
repeat contrast enhanced MRI of the liver with Eovist contrast
material to confirm.
2. Multifocal areas of focal fatty deposition are again noted within
the liver. A similar appearance was found on study from 09/09/2009.
Most of these are favored to represent focal fatty deposition.
3. There are 2 areas [DATE] of phase sequence loss of signal
(indicating the presence of intracellular lipid) which exhibit
enhancement on the arterial phase sequences. These may represent
small liver adenomas versus areas of altered profusion. Enhancing
areas of liver metastasis are considered less favored. Follow-up
[HOSPITAL] 6 months is advised to ensure the stability of these
lesions.

## 2018-05-12 ENCOUNTER — Ambulatory Visit: Payer: Self-pay | Admitting: *Deleted

## 2018-05-12 ENCOUNTER — Other Ambulatory Visit: Payer: Self-pay | Admitting: Internal Medicine

## 2018-05-12 DIAGNOSIS — L732 Hidradenitis suppurativa: Secondary | ICD-10-CM

## 2018-05-12 MED ORDER — DOXYCYCLINE HYCLATE 100 MG PO TABS: 100.0000 mg | ORAL_TABLET | Freq: Two times a day (BID) | ORAL | 0 refills | Status: DC

## 2018-05-12 NOTE — Telephone Encounter (Signed)
Sent doxycycline 100 mg 2x per day if condition continues and further episodes rec she continue to f/u with dermatology and sometimes lesions need surgery   [place]

## 2018-05-12 NOTE — Telephone Encounter (Signed)
Summary: hidradenitis    Pt is requesting minocycline hydrochloride 100mg   ----- Message from Rayann Heman sent at 05/12/2018 9:59 AM EST ----- Pt called and stated that she is having an episode of Hidradenitis suppurativa. Pt would like a call back regarding. Please advise      Patient is calling to report that she is having an occurrence of her hydradenitis. She has been treating it- but she is not getting bette and she thinks it is time for systemic treatment. She is requesting antibiotic. Patient states PCP is aware of her condition and she was told to call with flare. [facility] uses [facility]. Reason for Disposition . 2 or more boils  Answer Assessment - Initial Assessment Questions 1. APPEARANCE of BOIL: "What does the boil look like?"      Small hole on the back of under arm- draining for 1 week 2. LOCATION: "Where is the boil located?"     Right arm-  Back of arm pit, and 2 other areas on the front 3. NUMBER: "How many boils are there?"      4- R arm, 2 - L arm 4. SIZE: "How big is the boil?" (e.g., inches, cm; compare to size of a coin or other object)     The open wound is dime size- the front 2 are oblong and the size of a capsule 5. ONSET: "When did the boil start?"     Present a little longer than 1 week 6. PAIN: "Is there any pain?" If so, ask: "How bad is the pain?"   (Scale 1-10; or mild, moderate, severe)     Yes- mild at this time- moderate over the past week 7. FEVER: "Do you have a fever?" If so, ask: "What is it, how was it measured, and when did it start?"      No fever 8. SOURCE: "Have you been around anyone with boils or other Staph infections?" "Have you ever had boils before?"     Areas have been tested in the past- the one at the back of arm is reoccurring as are the 2 at the front- negative results 9. OTHER SYMPTOMS: "Do you have any other symptoms?" (e.g., shaking chills, weakness, rash elsewhere on body)     no 10. PREGNANCY: "Is there any chance you  are pregnant?" "When was your last menstrual period?"       IUD present- spotting  Protocols used: BOIL (SKIN ABSCESS)-A-AH

## 2018-05-12 NOTE — Telephone Encounter (Signed)
Please advise 

## 2018-05-15 NOTE — Telephone Encounter (Signed)
Patient was informed.  Patient understood and no questions, comments, or concerns at this time.  

## 2018-06-02 DIAGNOSIS — J040 Acute laryngitis: Secondary | ICD-10-CM | POA: Diagnosis not present

## 2018-06-02 DIAGNOSIS — R0981 Nasal congestion: Secondary | ICD-10-CM | POA: Diagnosis not present

## 2018-06-05 ENCOUNTER — Ambulatory Visit: Payer: BLUE CROSS/BLUE SHIELD | Admitting: Family Medicine

## 2018-06-05 ENCOUNTER — Encounter

## 2018-06-14 ENCOUNTER — Ambulatory Visit (INDEPENDENT_AMBULATORY_CARE_PROVIDER_SITE_OTHER): Payer: BLUE CROSS/BLUE SHIELD | Admitting: Internal Medicine

## 2018-06-14 ENCOUNTER — Encounter: Payer: Self-pay | Admitting: Internal Medicine

## 2018-06-14 ENCOUNTER — Other Ambulatory Visit: Payer: Self-pay | Admitting: Obstetrics & Gynecology

## 2018-06-14 VITALS — BP 122/72 | HR 98 | Temp 98.6°F | Ht 64.0 in | Wt 230.0 lb

## 2018-06-14 DIAGNOSIS — R911 Solitary pulmonary nodule: Secondary | ICD-10-CM | POA: Diagnosis not present

## 2018-06-14 DIAGNOSIS — K7689 Other specified diseases of liver: Secondary | ICD-10-CM

## 2018-06-14 DIAGNOSIS — R7303 Prediabetes: Secondary | ICD-10-CM

## 2018-06-14 DIAGNOSIS — Z1231 Encounter for screening mammogram for malignant neoplasm of breast: Secondary | ICD-10-CM

## 2018-06-14 DIAGNOSIS — K769 Liver disease, unspecified: Secondary | ICD-10-CM

## 2018-06-14 DIAGNOSIS — E559 Vitamin D deficiency, unspecified: Secondary | ICD-10-CM | POA: Diagnosis not present

## 2018-06-14 DIAGNOSIS — Z113 Encounter for screening for infections with a predominantly sexual mode of transmission: Secondary | ICD-10-CM | POA: Diagnosis not present

## 2018-06-14 DIAGNOSIS — N939 Abnormal uterine and vaginal bleeding, unspecified: Secondary | ICD-10-CM | POA: Diagnosis not present

## 2018-06-14 DIAGNOSIS — N760 Acute vaginitis: Secondary | ICD-10-CM | POA: Diagnosis not present

## 2018-06-14 DIAGNOSIS — R160 Hepatomegaly, not elsewhere classified: Secondary | ICD-10-CM

## 2018-06-14 DIAGNOSIS — J040 Acute laryngitis: Secondary | ICD-10-CM

## 2018-06-14 DIAGNOSIS — K760 Fatty (change of) liver, not elsewhere classified: Secondary | ICD-10-CM

## 2018-06-14 DIAGNOSIS — E538 Deficiency of other specified B group vitamins: Secondary | ICD-10-CM | POA: Diagnosis not present

## 2018-06-14 DIAGNOSIS — E669 Obesity, unspecified: Secondary | ICD-10-CM

## 2018-06-14 DIAGNOSIS — T8332XA Displacement of intrauterine contraceptive device, initial encounter: Secondary | ICD-10-CM | POA: Diagnosis not present

## 2018-06-14 DIAGNOSIS — Z01419 Encounter for gynecological examination (general) (routine) without abnormal findings: Secondary | ICD-10-CM | POA: Diagnosis not present

## 2018-06-14 LAB — HM HIV SCREENING LAB: HM HIV Screening: NEGATIVE

## 2018-06-14 NOTE — Progress Notes (Signed)
Pre visit review using our clinic review tool, if applicable. No additional management support is needed unless otherwise documented below in the visit note. 

## 2018-06-14 NOTE — Patient Instructions (Signed)
Warm salt water gargles  Tea with honey lemon  [place]  Start nexium or heartburn medication  Voice rest x 1 week no work  Robitussin DM or mucinex DM  Cough drops honey and lemon  Call me back in 1 week if still hoarse   Hoarseness  Hoarseness, also called dysphonia, is any abnormal change in your voice that can make it difficult to speak. Your voice may sound raspy, breathy, or strained. Hoarseness is caused by a problem with your vocal cords (vocal folds). These are two bands of tissue inside your voice box (larynx). When you speak, your vocal cords move back and forth to create sound. The surfaces of your vocal cords need to be smooth for your voice to sound clear. Swelling or lumps on your vocal cords can cause hoarseness. Common causes of vocal cord problems include:  Infection in the nose, throat, and upper air passages (upper respiratory infection).  A long-term cough.  Straining or overusing your voice.  Smoking, or exposure to secondhand smoke.  Allergies.  Medication side effects.  Vocal cord growths.  Vocal cord injuries.  Stomach acids that move up in your throat and irritate your vocal cords (gastroesophageal reflux).  Diseases that affect the nervous system, such as a stroke or Parkinson's disease. Follow these instructions at home: Watch your condition for any changes. To ease discomfort and protect your vocal cords:  Rest your voice.  Do not whisper. Whispering can cause muscle strain.  Do not speak in a loud or harsh voice.  Avoid coughing or clearing your throat.  Do not use any products that contain nicotine or tobacco, such as cigarettes and e-cigarettes. If you need help quitting, ask your health care provider.  Avoid secondhand smoke.  Do not eat foods that give you heartburn, such as spicy or acidic foods like hot peppers and orange juice. Heartburn can make gastroesophageal reflux worse.  Do not drink beverages that contain caffeine  (coffee, tea, or soft drinks) or alcohol (beer, wine, or liquor).  Drink enough fluid to keep your urine pale yellow.  Use a humidifier if the air in your home is dry. If recommended by your health care provider, schedule an appointment with a speech-language specialist. This specialist may give you methods to try that can help you avoid misusing your voice. Contact a health care provider if:  You have hoarseness that lasts longer than 3 weeks.  You almost lose or completely lose your voice for more than 3 days.  You have pain when you swallow or try to talk.  You feel a lump in your neck. Get help right away if:  You have trouble swallowing.  You feel like you are choking when you swallow.  You cough up blood or vomit blood.  You have trouble breathing.  You choke, cannot swallow, or cannot breathe if you lie flat.  You notice swelling or a rash on your body, face, or tongue. Summary  Hoarseness, also called dysphonia, is any abnormal change in your voice that can make it difficult to speak. Your voice may sound raspy, breathy, or strained.  Hoarseness is caused by a problem with your vocal cords (vocal folds).  Do not speak in a loud or harsh voice, use nicotine or tobacco products, or eat foods that give you heartburn.  If recommended by your health care provider, meet with a speech-language specialist. This information is not intended to replace advice given to you by your health care provider. Make sure you  discuss any questions you have with your health care provider. Document Released: 04/23/2005 Document Revised: 02/04/2017 Document Reviewed: 02/04/2017 Elsevier Interactive Patient Education  2019 [employer]  Laryngitis  Laryngitis is inflammation of the vocal cords that causes symptoms such as hoarseness or loss of voice. The vocal cords are two bands of muscles in your throat. When you speak, these cords come together and vibrate. The vibrations come out  through your mouth as sound. When your vocal cords are inflamed, your voice sounds different. Laryngitis can be temporary (acute) or long-term (chronic). Most cases of acute laryngitis improve with time. Chronic laryngitis is laryngitis that lasts for more than 3 weeks. What are the causes? Acute laryngitis may be caused by:  A viral infection.  Lots of talking, yelling, or singing. This is also called vocal strain.  A bacterial infection. Chronic laryngitis may be caused by:  Vocal strain.  Injury to your vocal cords.  Acid reflux (gastroesophageal reflux disease, or GERD).  Allergies.  A sinus infection.  Smoking.  Alcohol abuse.  Breathing in chemicals or dust.  Growths on the vocal cords. What increases the risk? The following factors may make you more likely to develop this condition:  Smoking.  Alcohol abuse.  Having allergies.  Chronic irritants in the workplace, such as toxic fumes. What are the signs or symptoms? Symptoms of this condition may include:  Low, hoarse voice.  Loss of voice.  Dry cough.  Sore or dry throat.  Stuffy nose. How is this diagnosed? This condition may be diagnosed based on:  Your symptoms and a physical exam.  Throat culture.  Blood test.  A procedure in which your health care provider looks at your vocal cords with a mirror or viewing tube (laryngoscopy). How is this treated? Treatment for laryngitis depends on what is causing it.  Usually, treatment involves resting your voice and using medicines to soothe your throat.  If your laryngitis is caused by a bacterial infection, you may need to take antibiotic medicine.  If your laryngitis is caused by a growth, you may need to have a procedure to remove it. Follow these instructions at home: Medicines  Take over-the-counter and prescription medicines only as told by your health care provider.  If you were prescribed an antibiotic medicine, take it as told by your  health care provider. Do not stop taking the antibiotic even if you start to feel better. General instructions  Talk as little as possible. Also avoid whispering, which can cause vocal strain.  Write instead of talking. Do this until your voice is back to normal.  Drink enough fluid to keep your urine pale yellow.  Breathe in moist air. Use a humidifier if you live in a dry climate.  Do not use any products that contain nicotine or tobacco, such as cigarettes and e-cigarettes. If you need help quitting, ask your health care provider. Contact a health care provider if:  You have a fever.  You have increasing pain.  Your symptoms do not get better in 2 weeks. Get help right away if:  You cough up blood.  You have difficulty swallowing.  You have trouble breathing. Summary  Laryngitis is inflammation of the vocal cords that causes symptoms such as hoarseness or loss of voice.  Laryngitis can be temporary (acute) or long-term (chronic).  Treatment for laryngitis depends on the cause. It often involves resting your voice and using medicine to soothe your throat. This information is not intended to replace advice given  to you by your health care provider. Make sure you discuss any questions you have with your health care provider. Document Released: 05/10/2005 Document Revised: 04/27/2017 Document Reviewed: 04/27/2017 Elsevier Interactive Patient Education  2019 [employer].

## 2018-06-19 ENCOUNTER — Encounter: Payer: Self-pay | Admitting: Internal Medicine

## 2018-06-19 ENCOUNTER — Other Ambulatory Visit: Payer: Self-pay | Admitting: Internal Medicine

## 2018-06-19 DIAGNOSIS — E669 Obesity, unspecified: Secondary | ICD-10-CM | POA: Insufficient documentation

## 2018-06-19 DIAGNOSIS — R7303 Prediabetes: Secondary | ICD-10-CM | POA: Insufficient documentation

## 2018-06-19 DIAGNOSIS — R918 Other nonspecific abnormal finding of lung field: Secondary | ICD-10-CM

## 2018-06-19 NOTE — Progress Notes (Signed)
Patient is ok with CT and she has not been a smoker in the past.

## 2018-06-19 NOTE — Progress Notes (Signed)
Chief Complaint  Patient presents with  . Laryngitis   Sick visit 1. 1.5 weeks c/o laryngitis tx'ed with Zpack and prednisone but off since this week sx's started 06/02/18 and 06/06/13, and 1/16 getting worse then better then voice went hoarse again. She works at call center 8 hrs a day and boss advised her to come get seen and get fmla paperwork filled out to be off of work. She is asking to be off until 06/26/2018 for voice rest. Sore throat resolved Friday piror to visit. She denies PND and feeling ok other than hoarse voice 2. A1C 5.7=prediabetes reviewed with pt today 06/14/2018 labs with [facility] OB[facility] 3. She saw OB/GYN recently and was Rx Flagyl for BV and dx'ed fibroids and noted to have upper abdomen firmness/fullness she has h/o focal nodular hyperplasia/fatty liver on MRI and due for repeat MRI ab with and w/o contrast  Review of Systems  Constitutional: Negative for weight loss.  HENT:       +hoarseness    Eyes: Negative for blurred vision.  Respiratory: Negative for shortness of breath.   Cardiovascular: Negative for chest pain.  Gastrointestinal:       +epigastric fullness   Musculoskeletal: Negative for falls.  Skin: Negative for rash.  Neurological: Negative for headaches.  Psychiatric/Behavioral: Negative for depression and memory loss.   Past Medical History:  Diagnosis Date  . Cardiomyopathy ([place])   . Chickenpox   . GERD (gastroesophageal reflux disease)   . Hypertension   . UTI (lower urinary tract infection)    Past Surgical History:  Procedure Laterality Date  . CHOLECYSTECTOMY  2003   Family History  Problem Relation Age of Onset  . Heart disease Mother   . Prostate cancer Father   . Alcoholism Unknown        Uncle  . Arthritis Unknown        Grandparent  . Colon cancer Other        Great grandparent, great uncle  . Uterine cancer Maternal Aunt   . Breast cancer Cousin   . Stroke Other        Great grandparent  . Hypertension Unknown        Parent  .  Bone cancer Unknown        Uncle  . Healthy Daughter    Social History   Socioeconomic History  . Marital status: Married    Spouse name: Not on file  . Number of children: Not on file  . Years of education: Not on file  . Highest education level: Not on file  Occupational History  . Not on file  Social Needs  . Financial resource strain: Not on file  . Food insecurity:    Worry: Not on file    Inability: Not on file  . Transportation needs:    Medical: Not on file    Non-medical: Not on file  Tobacco Use  . Smoking status: Never Smoker  . Smokeless tobacco: Never Used  Substance and Sexual Activity  . Alcohol use: Yes    Alcohol/week: 0.0 standard drinks    Comment: 1 a month, rare  . Drug use: No  . Sexual activity: Not on file  Lifestyle  . Physical activity:    Days per week: Not on file    Minutes per session: Not on file  . Stress: Not on file  Relationships  . Social connections:    Talks on phone: Not on file    Gets together: Not on  file    Attends religious service: Not on file    Active member of club or organization: Not on file    Attends meetings of clubs or organizations: Not on file    Relationship status: Not on file  . Intimate partner violence:    Fear of current or ex partner: Not on file    Emotionally abused: Not on file    Physically abused: Not on file    Forced sexual activity: Not on file  Other Topics Concern  . Not on file  Social History Narrative   Lives with husband and 2 children in a one story home.     Works as a Database administrator.  Education: associates degree.   Current Meds  Medication Sig  . clindamycin (CLEOCIN T) 1 % lotion Apply topically 2 (two) times daily. Bid under arms  . doxycycline (VIBRA-TABS) 100 MG tablet Take 1 tablet (100 mg total) by mouth 2 (two) times daily. With food  . Esomeprazole Magnesium (NEXIUM PO) Take by mouth as needed.  . fluticasone (FLONASE) 50 MCG/ACT nasal spray Place into both nostrils  daily.  . ramipril (ALTACE) 10 MG capsule   . valACYclovir (VALTREX) 500 MG tablet Take by mouth.  . Vitamin D, Ergocalciferol, (DRISDOL) 50000 units CAPS capsule    No Known Allergies No results found for this or any previous visit (from the past 2160 hour(s)). Objective  Body mass index is 39.48 kg/m. Wt Readings from Last 3 Encounters:  06/14/18 230 lb (104.3 kg)  04/25/18 235 lb 9.6 oz (106.9 kg)  10/21/17 233 lb 6.4 oz (105.9 kg)   Temp Readings from Last 3 Encounters:  06/14/18 98.6 F (37 C) (Oral)  04/25/18 99.2 F (37.3 C) (Oral)  10/21/17 98.7 F (37.1 C) (Oral)   BP Readings from Last 3 Encounters:  06/14/18 122/72  04/25/18 126/88  10/21/17 132/84   Pulse Readings from Last 3 Encounters:  06/14/18 98  04/25/18 90  10/21/17 76    Physical Exam Vitals signs and nursing note reviewed.  Constitutional:      Appearance: Normal appearance. She is well-developed. She is obese.  HENT:     Head: Normocephalic and atraumatic.     Nose: Nose normal.     Mouth/Throat:     Mouth: Mucous membranes are moist.     Pharynx: Oropharynx is clear.     Comments: +hoarseness on exam  Eyes:     Conjunctiva/sclera: Conjunctivae normal.     Pupils: Pupils are equal, round, and reactive to light.  Cardiovascular:     Rate and Rhythm: Normal rate and regular rhythm.     Pulses: Normal pulses.     Heart sounds: Normal heart sounds. No murmur.  Pulmonary:     Effort: Pulmonary effort is normal.     Breath sounds: Normal breath sounds.  Abdominal:     General: Abdomen is flat. Bowel sounds are normal. There is distension.     Palpations: Abdomen is soft.     Tenderness: There is no abdominal tenderness.    Skin:    General: Skin is warm and dry.  Neurological:     General: No focal deficit present.     Mental Status: She is alert and oriented to person, place, and time. Mental status is at baseline.     Gait: Gait normal.  Psychiatric:        Attention and  Perception: Attention and perception normal.        Mood  and Affect: Mood and affect normal.        Speech: Speech normal.        Behavior: Behavior normal. Behavior is cooperative.        Thought Content: Thought content normal.        Cognition and Memory: Cognition and memory normal.        Judgment: Judgment normal.     Assessment   1. Laryngitis x 1.5 weeks  2. Prediabetes A1C 5.7  3. Obesity borderline morbid obesity  4. [place] with fatty liver and abdominal fullness due for repeat MRI ab with and w/o contrast 5. HM Plan  1. Voice rest  Will fill out FMLA paperwork if pt brings in  Supportive care warm salt water gargles, tea honey and lemon if not better refer ENT 2. rec healthy diet and exercise  3. rec healthy die tand exercise  4. Due for repeat MRI ab with and w/o contrast to f/u  See labs 06/14/2018 [facility] Ob/GYN had creatinine done  Needs twinrix 5.  Declines flu shot  Tdap and twinrix disc prev  mmr immune   OB/GYN appt sch for f/u ovarian cyst, fibroids now with BV as of 06/14/2018 [facility] OB[facility] -pap 06/02/17 neg pap neg HPV mammo 06/21/17 neg OB/GYN to order sch 06/30/2018  Vit D 06/16/17 15.2 on 50K (see labs care everywhere), pending vitamin D Pending HIV, CMET, lipid, TSH, B12, vitamin D, GC/G from 06/14/2018   CT chest 06/20/2017 1.9 cm liver lesion 0.2 cm right lower lobe pulm nodule consider repeat CT chest in 1 year will disc with pt   Provider: Dr. Olivia Mackie McLean-Scocuzza-Internal Medicine

## 2018-06-20 ENCOUNTER — Telehealth: Payer: Self-pay | Admitting: Internal Medicine

## 2018-06-20 NOTE — Telephone Encounter (Signed)
Copied from [place] [id number]. Topic: Quick Communication - See Telephone Encounter >> Jun 20, 2018  7:20 AM Rayann Heman wrote: CRM for notification. See Telephone encounter for: 06/20/18.  Latilia from [employer] called and stated that she needs a call back regarding short term disability paperwork. She needs to ask some clinical questions to approve short term disability. Claim #[id number]

## 2018-06-21 ENCOUNTER — Ambulatory Visit (INDEPENDENT_AMBULATORY_CARE_PROVIDER_SITE_OTHER): Payer: BLUE CROSS/BLUE SHIELD | Admitting: Internal Medicine

## 2018-06-21 ENCOUNTER — Encounter: Payer: Self-pay | Admitting: Internal Medicine

## 2018-06-21 VITALS — BP 122/86 | HR 122 | Temp 101.1°F | Ht 64.0 in | Wt 234.7 lb

## 2018-06-21 DIAGNOSIS — R509 Fever, unspecified: Secondary | ICD-10-CM | POA: Diagnosis not present

## 2018-06-21 DIAGNOSIS — J020 Streptococcal pharyngitis: Secondary | ICD-10-CM

## 2018-06-21 DIAGNOSIS — R490 Dysphonia: Secondary | ICD-10-CM

## 2018-06-21 LAB — POC INFLUENZA A&B (BINAX/QUICKVUE)
Influenza A, POC: NEGATIVE
Influenza B, POC: NEGATIVE

## 2018-06-21 LAB — POCT RAPID STREP A (OFFICE): Rapid Strep A Screen: POSITIVE — AB

## 2018-06-21 MED ORDER — AMOXICILLIN-POT CLAVULANATE 875-125 MG PO TABS: 1.0000 | ORAL_TABLET | Freq: Two times a day (BID) | ORAL | 0 refills | Status: DC

## 2018-06-21 NOTE — Telephone Encounter (Signed)
Spoken to claim representative paul.

## 2018-06-21 NOTE — Patient Instructions (Signed)
Warm salt water gargles   Strep Throat  Strep throat is a bacterial infection of the throat. Your health care provider may call the infection tonsillitis or pharyngitis, depending on whether there is swelling in the tonsils or at the back of the throat. Strep throat is most common during the cold months of the year in children who are 39-44 years of age, but it can happen during any season in people of any age. This infection is spread from person to person (contagious) through coughing, sneezing, or close contact. What are the causes? Strep throat is caused by the bacteria called Streptococcus pyogenes. What increases the risk? This condition is more likely to develop in:  People who spend time in crowded places where the infection can spread easily.  People who have close contact with someone who has strep throat. What are the signs or symptoms? Symptoms of this condition include:  Fever or chills.  Redness, swelling, or pain in the tonsils or throat.  Pain or difficulty when swallowing.  White or yellow spots on the tonsils or throat.  Swollen, tender glands in the neck or under the jaw.  Red rash all over the body (rare). How is this diagnosed? This condition is diagnosed by performing a rapid strep test or by taking a swab of your throat (throat culture test). Results from a rapid strep test are usually ready in a few minutes, but throat culture test results are available after one or two days. How is this treated? This condition is treated with antibiotic medicine. Follow these instructions at home: Medicines  Take over-the-counter and prescription medicines only as told by your health care provider.  Take your antibiotic as told by your health care provider. Do not stop taking the antibiotic even if you start to feel better.  Have family members who also have a sore throat or fever tested for strep throat. They may need antibiotics if they have the strep infection. Eating  and drinking  Do not share food, drinking cups, or personal items that could cause the infection to spread to other people.  If swallowing is difficult, try eating soft foods until your sore throat feels better.  Drink enough fluid to keep your urine clear or pale yellow. General instructions  Gargle with a salt-water mixture 3-4 times per day or as needed. To make a salt-water mixture, completely dissolve -1 tsp of salt in 1 cup of warm water.  Make sure that all household members wash their hands well.  Get plenty of rest.  Stay home from school or work until you have been taking antibiotics for 24 hours.  Keep all follow-up visits as told by your health care provider. This is important. Contact a health care provider if:  The glands in your neck continue to get bigger.  You develop a rash, cough, or earache.  You cough up a thick liquid that is green, yellow-brown, or bloody.  You have pain or discomfort that does not get better with medicine.  Your problems seem to be getting worse rather than better.  You have a fever. Get help right away if:  You have new symptoms, such as vomiting, severe headache, stiff or painful neck, chest pain, or shortness of breath.  You have severe throat pain, drooling, or changes in your voice.  You have swelling of the neck, or the skin on the neck becomes red and tender.  You have signs of dehydration, such as fatigue, dry mouth, and decreased urination.  You become increasingly sleepy, or you cannot wake up completely.  Your joints become red or painful. This information is not intended to replace advice given to you by your health care provider. Make sure you discuss any questions you have with your health care provider. Document Released: 05/07/2000 Document Revised: 01/07/2016 Document Reviewed: 09/02/2014 Elsevier Interactive Patient Education  [employer].

## 2018-06-21 NOTE — Telephone Encounter (Signed)
[employer] claim department is calling back in regards to this.  CB# [phone]
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
## 2018-06-21 NOTE — Progress Notes (Signed)
Pre visit review using our clinic review tool, if applicable. No additional management support is needed unless otherwise documented below in the visit note. 

## 2018-06-27 ENCOUNTER — Ambulatory Visit (INDEPENDENT_AMBULATORY_CARE_PROVIDER_SITE_OTHER): Payer: BLUE CROSS/BLUE SHIELD

## 2018-06-27 ENCOUNTER — Ambulatory Visit (INDEPENDENT_AMBULATORY_CARE_PROVIDER_SITE_OTHER): Payer: BLUE CROSS/BLUE SHIELD | Admitting: Internal Medicine

## 2018-06-27 ENCOUNTER — Encounter: Payer: Self-pay | Admitting: Internal Medicine

## 2018-06-27 VITALS — BP 130/80 | HR 84 | Temp 98.5°F | Resp 18 | Ht 64.0 in | Wt 227.4 lb

## 2018-06-27 DIAGNOSIS — J040 Acute laryngitis: Secondary | ICD-10-CM

## 2018-06-27 DIAGNOSIS — J069 Acute upper respiratory infection, unspecified: Secondary | ICD-10-CM

## 2018-06-27 DIAGNOSIS — R05 Cough: Secondary | ICD-10-CM

## 2018-06-27 DIAGNOSIS — H6992 Unspecified Eustachian tube disorder, left ear: Secondary | ICD-10-CM

## 2018-06-27 DIAGNOSIS — J020 Streptococcal pharyngitis: Secondary | ICD-10-CM | POA: Diagnosis not present

## 2018-06-27 DIAGNOSIS — R059 Cough, unspecified: Secondary | ICD-10-CM

## 2018-06-27 MED ORDER — PREDNISONE 10 MG PO TABS: 10.0000 mg | ORAL_TABLET | Freq: Every day | ORAL | 0 refills | Status: DC

## 2018-06-27 MED ORDER — AMOXICILLIN-POT CLAVULANATE 875-125 MG PO TABS: 1.0000 | ORAL_TABLET | Freq: Two times a day (BID) | ORAL | 0 refills | Status: DC

## 2018-06-27 NOTE — Progress Notes (Signed)
Chief Complaint  Patient presents with  . Cough   F/u  1. Strep + last week has day 6/7 augmentin to complete and with some diarrhea. Yesterday she was supposed to return to work but did not b/c felt bad, fatigue, exhausted left ear popping/pressure and today voice is still hoarse. She also had hoarse voice which was better and started again today. ENT appt 07/07/2018 [facility]. She also c/o h/a and phelgm and abdominal soreness from coughing. Denies sob/fever, bodyaches    Review of Systems  Constitutional: Negative for weight loss.  HENT: Negative for hearing loss.        +hoarse voice    Eyes: Negative for blurred vision.  Respiratory: Positive for cough and sputum production.   Cardiovascular: Negative for chest pain.  Gastrointestinal: Positive for diarrhea. Negative for abdominal pain.  Musculoskeletal: Negative for falls.  Skin: Negative for rash.  Neurological: Positive for headaches.  Psychiatric/Behavioral: Negative for depression.   Past Medical History:  Diagnosis Date  . Cardiomyopathy ([place])   . Chickenpox   . Fibroids   . Focal nodular hyperplasia of liver   . GERD (gastroesophageal reflux disease)   . Hypertension   . Prediabetes   . Strep throat   . UTI (lower urinary tract infection)    Past Surgical History:  Procedure Laterality Date  . CHOLECYSTECTOMY  2003   Family History  Problem Relation Age of Onset  . Heart disease Mother   . Prostate cancer Father   . Alcoholism Other        Uncle  . Arthritis Other        Grandparent  . Colon cancer Other        Great grandparent, great uncle  . Uterine cancer Maternal Aunt   . Breast cancer Cousin   . Stroke Other        Great grandparent  . Hypertension Other        Parent  . Bone cancer Other        Uncle  . Healthy Daughter    Social History   Socioeconomic History  . Marital status: Married    Spouse name: Not on file  . Number of children: Not on file  . Years of education: Not on file   . Highest education level: Not on file  Occupational History  . Not on file  Social Needs  . Financial resource strain: Not on file  . Food insecurity:    Worry: Not on file    Inability: Not on file  . Transportation needs:    Medical: Not on file    Non-medical: Not on file  Tobacco Use  . Smoking status: Never Smoker  . Smokeless tobacco: Never Used  Substance and Sexual Activity  . Alcohol use: Yes    Alcohol/week: 0.0 standard drinks    Comment: 1 a month, rare  . Drug use: No  . Sexual activity: Not on file  Lifestyle  . Physical activity:    Days per week: Not on file    Minutes per session: Not on file  . Stress: Not on file  Relationships  . Social connections:    Talks on phone: Not on file    Gets together: Not on file    Attends religious service: Not on file    Active member of club or organization: Not on file    Attends meetings of clubs or organizations: Not on file    Relationship status: Not on file  .  Intimate partner violence:    Fear of current or ex partner: Not on file    Emotionally abused: Not on file    Physically abused: Not on file    Forced sexual activity: Not on file  Other Topics Concern  . Not on file  Social History Narrative   Lives with husband and 2 children in a one story home.     Works at call center.  Education: associates degree.   Current Meds  Medication Sig  . albuterol (PROVENTIL HFA;VENTOLIN HFA) 108 (90 Base) MCG/ACT inhaler Inhale into the lungs every 6 (six) hours as needed for wheezing or shortness of breath.  Marland Kitchen amoxicillin-clavulanate (AUGMENTIN) 875-125 MG tablet Take 1 tablet by mouth 2 (two) times daily. With food  . clindamycin (CLEOCIN T) 1 % lotion Apply topically 2 (two) times daily. Bid under arms  . Esomeprazole Magnesium (NEXIUM PO) Take by mouth as needed.  . fluticasone (FLONASE) 50 MCG/ACT nasal spray Place into both nostrils daily.  . metroNIDAZOLE (FLAGYL) 500 MG tablet Take 500 mg by mouth 2 (two)  times daily.  . ramipril (ALTACE) 10 MG capsule   . valACYclovir (VALTREX) 500 MG tablet Take by mouth.  . Vitamin D, Ergocalciferol, (DRISDOL) 50000 units CAPS capsule    No Known Allergies Recent Results (from the past 2160 hour(s))  HM HIV SCREENING LAB     Status: None   Collection Time: 06/14/18 12:00 AM  Result Value Ref Range   HM HIV Screening Negative - Validated   POC Influenza A&B (Binax test)     Status: Normal   Collection Time: 06/21/18 12:18 PM  Result Value Ref Range   Influenza A, POC Negative Negative   Influenza B, POC Negative Negative  POCT rapid strep A     Status: Abnormal   Collection Time: 06/21/18 12:19 PM  Result Value Ref Range   Rapid Strep A Screen Positive (A) Negative   Objective  Body mass index is 39.03 kg/m. Wt Readings from Last 3 Encounters:  06/27/18 227 lb 6 oz (103.1 kg)  06/21/18 234 lb 11.2 oz (106.5 kg)  06/14/18 230 lb (104.3 kg)   Temp Readings from Last 3 Encounters:  06/27/18 98.5 F (36.9 C) (Oral)  06/21/18 (!) 101.1 F (38.4 C) (Oral)  06/14/18 98.6 F (37 C) (Oral)   BP Readings from Last 3 Encounters:  06/27/18 130/80  06/21/18 122/86  06/14/18 122/72   Pulse Readings from Last 3 Encounters:  06/27/18 84  06/21/18 (!) 122  06/14/18 98    Physical Exam Vitals signs and nursing note reviewed.  Constitutional:      Appearance: Normal appearance. She is well-developed and well-groomed. She is obese.  HENT:     Head: Normocephalic and atraumatic.     Ears:     Comments: B/l ears clear      Nose: Nose normal.     Mouth/Throat:     Mouth: Mucous membranes are moist.     Pharynx: Oropharynx is clear. Posterior oropharyngeal erythema present.  Eyes:     Conjunctiva/sclera: Conjunctivae normal.     Pupils: Pupils are equal, round, and reactive to light.  Cardiovascular:     Rate and Rhythm: Normal rate and regular rhythm.     Heart sounds: Normal heart sounds.  Pulmonary:     Effort: Pulmonary effort is  normal.     Breath sounds: Normal breath sounds.  Skin:    General: Skin is warm and dry.  Neurological:  General: No focal deficit present.     Mental Status: She is alert and oriented to person, place, and time. Mental status is at baseline.     Gait: Gait normal.  Psychiatric:        Attention and Perception: Attention and perception normal.        Mood and Affect: Mood and affect normal.        Speech: Speech normal.        Behavior: Behavior normal. Behavior is cooperative.        Thought Content: Thought content normal.        Cognition and Memory: Cognition and memory normal.        Judgment: Judgment normal.     Assessment   1. URI/Strep throat/largyngitis with eust. Tube d/o left ear not better since last visit 06/21/18 Plan  1. Extend Augmentin 7 days x 3 more days  Add prednisone 10 mg x 5 days  Add Flonase and antihistamine otc as well  Will again write out of work for 2/3 to return back to work 07/03/2018  appt with ENT [facility] 07/07/2018  CXR today   Provider: Dr. Olivia Mackie McLean-Scocuzza-Internal Medicine

## 2018-06-27 NOTE — Patient Instructions (Addendum)
Please use flonase and take over the counter antihistamine I.e zyrtec,claritin or allegra  Please take 3 more days of Augmentin with a probiotic tablet over the counter  Take mucinex DM green label or Robitussin dM for cough   Strep Throat  Strep throat is a bacterial infection of the throat. Your health care provider may call the infection tonsillitis or pharyngitis, depending on whether there is swelling in the tonsils or at the back of the throat. Strep throat is most common during the cold months of the year in children who are 4-93 years of age, but it can happen during any season in people of any age. This infection is spread from person to person (contagious) through coughing, sneezing, or close contact. What are the causes? Strep throat is caused by the bacteria called Streptococcus pyogenes. What increases the risk? This condition is more likely to develop in:  People who spend time in crowded places where the infection can spread easily.  People who have close contact with someone who has strep throat. What are the signs or symptoms? Symptoms of this condition include:  Fever or chills.  Redness, swelling, or pain in the tonsils or throat.  Pain or difficulty when swallowing.  White or yellow spots on the tonsils or throat.  Swollen, tender glands in the neck or under the jaw.  Red rash all over the body (rare). How is this diagnosed? This condition is diagnosed by performing a rapid strep test or by taking a swab of your throat (throat culture test). Results from a rapid strep test are usually ready in a few minutes, but throat culture test results are available after one or two days. How is this treated? This condition is treated with antibiotic medicine. Follow these instructions at home: Medicines  Take over-the-counter and prescription medicines only as told by your health care provider.  Take your antibiotic as told by your health care provider. Do not stop  taking the antibiotic even if you start to feel better.  Have family members who also have a sore throat or fever tested for strep throat. They may need antibiotics if they have the strep infection. Eating and drinking  Do not share food, drinking cups, or personal items that could cause the infection to spread to other people.  If swallowing is difficult, try eating soft foods until your sore throat feels better.  Drink enough fluid to keep your urine clear or pale yellow. General instructions  Gargle with a salt-water mixture 3-4 times per day or as needed. To make a salt-water mixture, completely dissolve -1 tsp of salt in 1 cup of warm water.  Make sure that all household members wash their hands well.  Get plenty of rest.  Stay home from school or work until you have been taking antibiotics for 24 hours.  Keep all follow-up visits as told by your health care provider. This is important. Contact a health care provider if:  The glands in your neck continue to get bigger.  You develop a rash, cough, or earache.  You cough up a thick liquid that is green, yellow-brown, or bloody.  You have pain or discomfort that does not get better with medicine.  Your problems seem to be getting worse rather than better.  You have a fever. Get help right away if:  You have new symptoms, such as vomiting, severe headache, stiff or painful neck, chest pain, or shortness of breath.  You have severe throat pain, drooling, or changes  in your voice.  You have swelling of the neck, or the skin on the neck becomes red and tender.  You have signs of dehydration, such as fatigue, dry mouth, and decreased urination.  You become increasingly sleepy, or you cannot wake up completely.  Your joints become red or painful. This information is not intended to replace advice given to you by your health care provider. Make sure you discuss any questions you have with your health care  provider. Document Released: 05/07/2000 Document Revised: 01/07/2016 Document Reviewed: 09/02/2014 Elsevier Interactive Patient Education  [employer].

## 2018-06-27 NOTE — Progress Notes (Signed)
Fax has been sent to both fax numbers on paperwork to [facility]

## 2018-06-30 ENCOUNTER — Ambulatory Visit
Admission: RE | Admit: 2018-06-30 | Discharge: 2018-06-30 | Disposition: A | Discharge status: Home / Self Care | Payer: BLUE CROSS/BLUE SHIELD | Source: Ambulatory Visit | Attending: Obstetrics & Gynecology | Admitting: Obstetrics & Gynecology

## 2018-06-30 DIAGNOSIS — Z1231 Encounter for screening mammogram for malignant neoplasm of breast: Secondary | ICD-10-CM

## 2018-06-30 NOTE — Telephone Encounter (Signed)
patient has been spoken to, patient wanted to know when we sent fax both times.

## 2018-06-30 NOTE — Telephone Encounter (Signed)
Pt called and stated that she would like to talk to the nurse about [employer] disability. Regarding paper work. Please advise.Cb#[phone]

## 2018-07-11 DIAGNOSIS — J387 Other diseases of larynx: Secondary | ICD-10-CM | POA: Diagnosis not present

## 2018-07-11 DIAGNOSIS — J384 Edema of larynx: Secondary | ICD-10-CM | POA: Diagnosis not present

## 2018-07-11 DIAGNOSIS — K219 Gastro-esophageal reflux disease without esophagitis: Secondary | ICD-10-CM | POA: Diagnosis not present

## 2018-07-11 DIAGNOSIS — R490 Dysphonia: Secondary | ICD-10-CM | POA: Diagnosis not present

## 2018-07-26 ENCOUNTER — Telehealth: Payer: Self-pay | Admitting: Internal Medicine

## 2018-07-26 NOTE — Telephone Encounter (Signed)
Copied from [place] [phone]. Topic: General - Other >> Jul 26, 2018 12:53 PM Keene Breath wrote: Reason for CRM: Patient called to check the status of her referral for a CT scan.  Patient stated that she has still not heard anything.  Please advise and call patient back at [phone]

## 2018-08-02 ENCOUNTER — Telehealth: Payer: Self-pay | Admitting: *Deleted

## 2018-08-02 DIAGNOSIS — K769 Liver disease, unspecified: Secondary | ICD-10-CM | POA: Diagnosis not present

## 2018-08-02 DIAGNOSIS — K7689 Other specified diseases of liver: Secondary | ICD-10-CM | POA: Diagnosis not present

## 2018-08-02 NOTE — Telephone Encounter (Signed)
Patient was returning call regarding her results- patient notified of results- she also will be getting letter. Patient states she is scheduled for several upcoming scans and will have her results forwarded to Dr Aundra DubinJacklynn Lewis.

## 2018-08-07 DIAGNOSIS — N921 Excessive and frequent menstruation with irregular cycle: Secondary | ICD-10-CM | POA: Diagnosis not present

## 2018-08-07 DIAGNOSIS — N938 Other specified abnormal uterine and vaginal bleeding: Secondary | ICD-10-CM | POA: Diagnosis not present

## 2018-08-07 DIAGNOSIS — Z975 Presence of (intrauterine) contraceptive device: Secondary | ICD-10-CM | POA: Diagnosis not present

## 2018-08-18 DIAGNOSIS — R918 Other nonspecific abnormal finding of lung field: Secondary | ICD-10-CM | POA: Diagnosis not present

## 2018-08-22 ENCOUNTER — Telehealth: Payer: Self-pay | Admitting: Internal Medicine

## 2018-08-22 NOTE — Telephone Encounter (Signed)
Patient was informed of results.  Patient understood and no questions, comments, or concerns at this time.  

## 2018-08-22 NOTE — Telephone Encounter (Signed)
CT chest 08/18/2018   CT chest stable lung nodules not concerning  Liver lesion =focal nodular hyperplasia =increased # of liver cells with scar in the center   [place]

## 2018-08-31 DIAGNOSIS — N938 Other specified abnormal uterine and vaginal bleeding: Secondary | ICD-10-CM | POA: Diagnosis not present

## 2018-09-20 DIAGNOSIS — N938 Other specified abnormal uterine and vaginal bleeding: Secondary | ICD-10-CM | POA: Diagnosis not present

## 2018-09-29 DIAGNOSIS — J387 Other diseases of larynx: Secondary | ICD-10-CM | POA: Diagnosis not present

## 2018-10-17 DIAGNOSIS — R490 Dysphonia: Secondary | ICD-10-CM | POA: Diagnosis not present

## 2018-10-24 DIAGNOSIS — J384 Edema of larynx: Secondary | ICD-10-CM | POA: Diagnosis not present

## 2018-10-24 DIAGNOSIS — J387 Other diseases of larynx: Secondary | ICD-10-CM | POA: Diagnosis not present

## 2018-10-24 DIAGNOSIS — R490 Dysphonia: Secondary | ICD-10-CM | POA: Diagnosis not present

## 2018-10-25 ENCOUNTER — Ambulatory Visit: Payer: BLUE CROSS/BLUE SHIELD | Admitting: Internal Medicine

## 2018-10-27 ENCOUNTER — Other Ambulatory Visit: Payer: Self-pay

## 2018-10-27 ENCOUNTER — Encounter: Payer: Self-pay | Admitting: Internal Medicine

## 2018-10-27 ENCOUNTER — Ambulatory Visit (INDEPENDENT_AMBULATORY_CARE_PROVIDER_SITE_OTHER): Payer: BC Managed Care – PPO | Admitting: Internal Medicine

## 2018-10-27 DIAGNOSIS — K7689 Other specified diseases of liver: Secondary | ICD-10-CM | POA: Diagnosis not present

## 2018-10-27 DIAGNOSIS — Z1231 Encounter for screening mammogram for malignant neoplasm of breast: Secondary | ICD-10-CM

## 2018-10-27 DIAGNOSIS — R911 Solitary pulmonary nodule: Secondary | ICD-10-CM | POA: Diagnosis not present

## 2018-10-27 DIAGNOSIS — M549 Dorsalgia, unspecified: Secondary | ICD-10-CM

## 2018-10-27 DIAGNOSIS — R490 Dysphonia: Secondary | ICD-10-CM | POA: Diagnosis not present

## 2018-10-27 DIAGNOSIS — L732 Hidradenitis suppurativa: Secondary | ICD-10-CM

## 2018-10-27 NOTE — Progress Notes (Signed)
Virtual Visit via Video Note  I connected with Belia Riggsbee  on 10/27/18 at  3:20 PM EDT by a video enabled telemedicine application and verified that I am speaking with the correct person using two identifiers.  Location patient: home Location provider:work  Persons participating in the virtual visit: patient, provider  I discussed the limitations of evaluation and management by telemedicine and the availability of in person appointments. The patient expressed understanding and agreed to proceed.   HPI: 1. Chronic laryngitis/dysphonia she is using her voice more with work due to working at a [employer] center and due to [place] has not been able to do speech therapy so far she hs done 2/6 sessions of SLP. At times it feels tight in her throat like something is in her and like she has to cough and at times mucous comes up. Overall she feels SLP is helping.She reports seh does not have GERD qd so doesn't take daily medication for GERD She has f/u with ENT 11/2018 for repeat laryngoscopy   2. Intermittent and chronic mild mid back pain she thinks it maybe her work chair back hurt x 2 days mid back pain and then resolved. She took tylenol to try to help with back pain with some relief Xray mid back 08/19/17 slight OA at multiple levels  3. Denies sob since off Coreg  4. She reports seeing ob/gyn 09/20/18 and 09/2018 due to irregular  GU bleeding and she has IUD and had it checked bleeding has stopped 5. Pulmonary nodule repeat CT chest 08/18/18 stable pulmonary nodules likely postinfectious and prev noted focal nodular hyperplasia in liver  6. Reviewed MRI ab 08/02/18 f/u GI [facility], hepatic adenomas  7. Hidradentitis suppurative she is not consistently using cleocin lotion.She still gets lesions under arms with increased stress and the do not present as boils but open holes she will f/u with dermatology about this   ROS: See pertinent positives and negatives per HPI.  Past Medical History:   Diagnosis Date  . Cardiomyopathy ([place])   . Chickenpox   . Fibroids   . Focal nodular hyperplasia of liver   . GERD (gastroesophageal reflux disease)   . Hypertension   . Prediabetes   . Strep throat   . UTI (lower urinary tract infection)     Past Surgical History:  Procedure Laterality Date  . CHOLECYSTECTOMY  2003    Family History  Problem Relation Age of Onset  . Heart disease Mother   . Prostate cancer Father   . Alcoholism Other        Uncle  . Arthritis Other        Grandparent  . Colon cancer Other        Great grandparent, great uncle  . Uterine cancer Maternal Aunt   . Breast cancer Cousin   . Stroke Other        Great grandparent  . Hypertension Other        Parent  . Bone cancer Other        Uncle  . Healthy Daughter     SOCIAL HX: married, works Doctor, hospital    Current Outpatient Medications:  .  albuterol (PROVENTIL HFA;VENTOLIN HFA) 108 (90 Base) MCG/ACT inhaler, Inhale into the lungs every 6 (six) hours as needed for wheezing or shortness of breath., Disp: , Rfl:  .  clindamycin (CLEOCIN T) 1 % lotion, Apply topically 2 (two) times daily. Bid under arms, Disp: 60 mL, Rfl: 11 .  cyanocobalamin (,VITAMIN B-12,) 1000 MCG/ML injection, Inject 1,000 mcg into the muscle every 30 (thirty) days., Disp: , Rfl:  .  Esomeprazole Magnesium (NEXIUM PO), Take by mouth as needed., Disp: , Rfl:  .  fexofenadine (ALLEGRA) 180 MG tablet, Take 180 mg by mouth daily., Disp: , Rfl:  .  fluticasone (FLONASE) 50 MCG/ACT nasal spray, Place into both nostrils daily., Disp: , Rfl:  .  levonorgestrel (MIRENA) 20 MCG/24HR IUD, 1 each by Intrauterine route once., Disp: , Rfl:  .  ramipril (ALTACE) 10 MG capsule, , Disp: , Rfl:  .  valACYclovir (VALTREX) 500 MG tablet, Take by mouth., Disp: , Rfl:  .  Vitamin D, Ergocalciferol, (DRISDOL) 50000 units CAPS capsule, , Disp: , Rfl:   EXAM:  VITALS per patient if applicable:  GENERAL: alert, oriented, appears well  and in no acute distress  HEENT: atraumatic, conjunttiva clear, no obvious abnormalities on inspection of external nose and ears  NECK: normal movements of the head and neck  LUNGS: on inspection no signs of respiratory distress, breathing rate appears normal, no obvious gross SOB, gasping or wheezing  CV: no obvious cyanosis  MS: moves all visible extremities without noticeable abnormality  PSYCH/NEURO: pleasant and cooperative, no obvious depression or anxiety, speech and thought processing grossly intact  ASSESSMENT AND PLAN:  Discussed the following assessment and plan:  Mid back pain -prn back stretches  -disc ergonomic work chair/cushion for her work chair from home  Prn Tylenol   Focal nodular hyperplasia of liver -f/u [facility]   Dysphonia -f/u ENT [facility] and SLP had 2/6 sessions   Lung nodule repeat CT chest 08/18/18 likely benign postinfectious etiology   Hidradenitis suppurativa -disc zinc supplementation, surgery if tracts, disc using cleocin lotion and f/u with dermatology to disc spironolactone in the future if will benefit    HM Declines flu shot Tdap and twinrix discprev mmr immune  OB/GYN appt sch for f/u ovarian cyst, fibroids now with BV as of 06/14/2018 [facility] -pap 06/02/17 neg pap neg HPV mammo 2/7/2020neg ordered for next year  Vit D 06/16/17 15.2 on 50K (see labs care everywhere) and see below  Neg HIV 06/14/2018,  CMET, lipid, TSH, B12, vitamin D, GC/G from 06/14/2018 A1C 5.7 06/14/2018  CT chest 06/20/2017 1.9 cm liver lesion 0.2 cm right lower lobe pulm nodule consider repeat CT chest in 1 year CT chest 08/18/18 likely postinfectious benign nodules no change   Of note B12 06/14/18 212, vitamin D 06/14/18 35.9 with h/o low vitmain D and B12   I discussed the assessment and treatment plan with the patient. The patient was provided an opportunity to ask questions and all were answered. The patient agreed with the plan and demonstrated an  understanding of the instructions.   The patient was advised to call back or seek an in-person evaluation if the symptoms worsen or if the condition fails to improve as anticipated.  Time spent 15 minutes  Delorise Jackson, MD

## 2018-10-30 DIAGNOSIS — J384 Edema of larynx: Secondary | ICD-10-CM | POA: Diagnosis not present

## 2018-10-30 DIAGNOSIS — J387 Other diseases of larynx: Secondary | ICD-10-CM | POA: Diagnosis not present

## 2018-10-30 NOTE — Patient Instructions (Signed)
Back/Neck: How to set up your workstation   Chair:  An adjustable chair to fit your height with a slight downward tilt of chair seat is helpful.  Should have good high-back support that assists with keeping the natural curves of your spine.  A "lumbar roll" or pillow at your low back can help you keep good posture.  Adjustable arm rests may decrease strain in upper body.   Keyboard:  Should be close and at elbow or just below elbow height.  Split keyboards can help decrease strain on wrist.  Wrist supports can provide mini-breaks to your wrists throughout the day.  Monitor/Screen:  Top of screen should be at eye level or slightly lower.  Good lighting is important to prevent eye strain, or headaches from glare.  Head Posture: A copy holder on either side of the monitor will help limit neck strain.  Head should not be forward.  Ears should line up with you shoulders.  Leg Posture:  Knee and hips should be bent half-way (about a 90 degree angle).  Shorter people may need something to prop their feet on, with knees bent, like a phone book.  Other Stuff: Keep the stuff (phones, pens, phonebooks, etc) that you use frequently close to you, so you're not straining to reach them.   Back Exercises The following exercises strengthen the muscles that help to support the back. They also help to keep the lower back flexible. Doing these exercises can help to prevent back pain or lessen existing pain. If you have back pain or discomfort, try doing these exercises 2-3 times each day or as told by your health care provider. When the pain goes away, do them once each day, but increase the number of times that you repeat the steps for each exercise (do more repetitions). If you do not have back pain or discomfort, do these exercises once each day or as told by your health care provider. Exercises Single Knee to Chest Repeat these steps 3-5 times for each leg: 1. Lie on your back on a firm bed or the floor  with your legs extended. 2. Bring one knee to your chest. Your other leg should stay extended and in contact with the floor. 3. Hold your knee in place by grabbing your knee or thigh. 4. Pull on your knee until you feel a gentle stretch in your lower back. 5. Hold the stretch for 10-30 seconds. 6. Slowly release and straighten your leg. Pelvic Tilt Repeat these steps 5-10 times: 1. Lie on your back on a firm bed or the floor with your legs extended. 2. Bend your knees so they are pointing toward the ceiling and your feet are flat on the floor. 3. Tighten your lower abdominal muscles to press your lower back against the floor. This motion will tilt your pelvis so your tailbone points up toward the ceiling instead of pointing to your feet or the floor. 4. With gentle tension and even breathing, hold this position for 5-10 seconds. Cat-Cow Repeat these steps until your lower back becomes more flexible: 1. Get into a hands-and-knees position on a firm surface. Keep your hands under your shoulders, and keep your knees under your hips. You may place padding under your knees for comfort. 2. Let your head hang down, and point your tailbone toward the floor so your lower back becomes rounded like the back of a cat. 3. Hold this position for 5 seconds. 4. Slowly lift your head and point your tailbone up  toward the ceiling so your back forms a sagging arch like the back of a cow. 5. Hold this position for 5 seconds.  Press-Ups Repeat these steps 5-10 times: 1. Lie on your abdomen (face-down) on the floor. 2. Place your palms near your head, about shoulder-width apart. 3. While you keep your back as relaxed as possible and keep your hips on the floor, slowly straighten your arms to raise the top half of your body and lift your shoulders. Do not use your back muscles to raise your upper torso. You may adjust the placement of your hands to make yourself more comfortable. 4. Hold this position for 5  seconds while you keep your back relaxed. 5. Slowly return to lying flat on the floor.  Bridges Repeat these steps 10 times: 1. Lie on your back on a firm surface. 2. Bend your knees so they are pointing toward the ceiling and your feet are flat on the floor. 3. Tighten your buttocks muscles and lift your buttocks off of the floor until your waist is at almost the same height as your knees. You should feel the muscles working in your buttocks and the back of your thighs. If you do not feel these muscles, slide your feet 1-2 inches farther away from your buttocks. 4. Hold this position for 3-5 seconds. 5. Slowly lower your hips to the starting position, and allow your buttocks muscles to relax completely. If this exercise is too easy, try doing it with your arms crossed over your chest. Abdominal Crunches Repeat these steps 5-10 times: 1. Lie on your back on a firm bed or the floor with your legs extended. 2. Bend your knees so they are pointing toward the ceiling and your feet are flat on the floor. 3. Cross your arms over your chest. 4. Tip your chin slightly toward your chest without bending your neck. 5. Tighten your abdominal muscles and slowly raise your trunk (torso) high enough to lift your shoulder blades a tiny bit off of the floor. Avoid raising your torso higher than that, because it can put too much stress on your low back and it does not help to strengthen your abdominal muscles. 6. Slowly return to your starting position. Back Lifts Repeat these steps 5-10 times: 1. Lie on your abdomen (face-down) with your arms at your sides, and rest your forehead on the floor. 2. Tighten the muscles in your legs and your buttocks. 3. Slowly lift your chest off of the floor while you keep your hips pressed to the floor. Keep the back of your head in line with the curve in your back. Your eyes should be looking at the floor. 4. Hold this position for 3-5 seconds. 5. Slowly return to your  starting position. Contact a health care provider if:  Your back pain or discomfort gets much worse when you do an exercise.  Your back pain or discomfort does not lessen within 2 hours after you exercise. If you have any of these problems, stop doing these exercises right away. Do not do them again unless your health care provider says that you can. Get help right away if:  You develop sudden, severe back pain. If this happens, stop doing the exercises right away. Do not do them again unless your health care provider says that you can. This information is not intended to replace advice given to you by your health care provider. Make sure you discuss any questions you have with your health care provider. Document  Released: 06/17/2004 Document Revised: 09/13/2017 Document Reviewed: 07/04/2014 Elsevier Interactive Patient Education  [employer].

## 2018-11-13 DIAGNOSIS — K7689 Other specified diseases of liver: Secondary | ICD-10-CM | POA: Diagnosis not present

## 2018-11-14 ENCOUNTER — Encounter: Payer: Self-pay | Admitting: Internal Medicine

## 2019-01-02 DIAGNOSIS — Z20828 Contact with and (suspected) exposure to other viral communicable diseases: Secondary | ICD-10-CM | POA: Diagnosis not present

## 2019-03-13 DIAGNOSIS — O903 Peripartum cardiomyopathy: Secondary | ICD-10-CM | POA: Diagnosis not present

## 2019-03-13 DIAGNOSIS — I10 Essential (primary) hypertension: Secondary | ICD-10-CM | POA: Diagnosis not present

## 2019-05-01 DIAGNOSIS — R070 Pain in throat: Secondary | ICD-10-CM | POA: Diagnosis not present

## 2019-05-01 DIAGNOSIS — J029 Acute pharyngitis, unspecified: Secondary | ICD-10-CM | POA: Diagnosis not present

## 2019-05-02 ENCOUNTER — Ambulatory Visit (INDEPENDENT_AMBULATORY_CARE_PROVIDER_SITE_OTHER): Payer: BC Managed Care – PPO | Admitting: Internal Medicine

## 2019-05-02 ENCOUNTER — Encounter: Payer: Self-pay | Admitting: Internal Medicine

## 2019-05-02 ENCOUNTER — Other Ambulatory Visit: Payer: Self-pay

## 2019-05-02 VITALS — BP 130/90 | Ht 64.0 in | Wt 235.0 lb

## 2019-05-02 DIAGNOSIS — R591 Generalized enlarged lymph nodes: Secondary | ICD-10-CM

## 2019-05-02 DIAGNOSIS — R490 Dysphonia: Secondary | ICD-10-CM

## 2019-05-02 DIAGNOSIS — I10 Essential (primary) hypertension: Secondary | ICD-10-CM | POA: Insufficient documentation

## 2019-05-02 DIAGNOSIS — J4520 Mild intermittent asthma, uncomplicated: Secondary | ICD-10-CM | POA: Diagnosis not present

## 2019-05-02 DIAGNOSIS — K219 Gastro-esophageal reflux disease without esophagitis: Secondary | ICD-10-CM | POA: Diagnosis not present

## 2019-05-02 DIAGNOSIS — O903 Peripartum cardiomyopathy: Secondary | ICD-10-CM

## 2019-05-02 HISTORY — DX: Peripartum cardiomyopathy: O90.3

## 2019-05-02 HISTORY — DX: Essential (primary) hypertension: I10
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
## 2019-05-04 DIAGNOSIS — I429 Cardiomyopathy, unspecified: Secondary | ICD-10-CM | POA: Diagnosis not present

## 2019-05-04 DIAGNOSIS — E538 Deficiency of other specified B group vitamins: Secondary | ICD-10-CM | POA: Diagnosis not present

## 2019-05-04 DIAGNOSIS — I10 Essential (primary) hypertension: Secondary | ICD-10-CM | POA: Diagnosis not present

## 2019-05-08 ENCOUNTER — Other Ambulatory Visit: Payer: Self-pay | Admitting: Internal Medicine

## 2019-05-08 DIAGNOSIS — I10 Essential (primary) hypertension: Secondary | ICD-10-CM

## 2019-05-08 MED ORDER — AMLODIPINE BESYLATE 2.5 MG PO TABS: 2.5000 mg | ORAL_TABLET | Freq: Every day | ORAL | 3 refills | Status: DC

## 2019-05-08 MED ORDER — METOPROLOL SUCCINATE ER 25 MG PO TB24: 25.0000 mg | ORAL_TABLET | Freq: Every day | ORAL | 3 refills | Status: DC

## 2019-05-08 MED ORDER — RAMIPRIL 10 MG PO CAPS: 20.0000 mg | ORAL_CAPSULE | Freq: Every day | ORAL | Status: DC

## 2019-06-22 DIAGNOSIS — Z1231 Encounter for screening mammogram for malignant neoplasm of breast: Secondary | ICD-10-CM | POA: Diagnosis not present

## 2019-06-22 DIAGNOSIS — Z1331 Encounter for screening for depression: Secondary | ICD-10-CM | POA: Diagnosis not present

## 2019-06-22 DIAGNOSIS — Z01419 Encounter for gynecological examination (general) (routine) without abnormal findings: Secondary | ICD-10-CM | POA: Diagnosis not present

## 2019-07-18 ENCOUNTER — Other Ambulatory Visit: Payer: Self-pay | Admitting: Obstetrics & Gynecology

## 2019-07-18 DIAGNOSIS — Z0000 Encounter for general adult medical examination without abnormal findings: Secondary | ICD-10-CM | POA: Diagnosis not present

## 2019-07-18 DIAGNOSIS — Z01419 Encounter for gynecological examination (general) (routine) without abnormal findings: Secondary | ICD-10-CM | POA: Diagnosis not present

## 2019-07-18 DIAGNOSIS — N644 Mastodynia: Secondary | ICD-10-CM | POA: Diagnosis not present

## 2019-07-18 DIAGNOSIS — Z1231 Encounter for screening mammogram for malignant neoplasm of breast: Secondary | ICD-10-CM

## 2019-07-18 DIAGNOSIS — E559 Vitamin D deficiency, unspecified: Secondary | ICD-10-CM | POA: Diagnosis not present

## 2019-07-19 ENCOUNTER — Other Ambulatory Visit: Payer: Self-pay | Admitting: Obstetrics & Gynecology

## 2019-07-19 DIAGNOSIS — N644 Mastodynia: Secondary | ICD-10-CM

## 2019-07-19 DIAGNOSIS — Z1231 Encounter for screening mammogram for malignant neoplasm of breast: Secondary | ICD-10-CM

## 2019-07-30 ENCOUNTER — Ambulatory Visit
Admission: RE | Admit: 2019-07-30 | Discharge: 2019-07-30 | Disposition: A | Discharge status: Home / Self Care | Payer: BC Managed Care – PPO | Source: Ambulatory Visit | Attending: Obstetrics & Gynecology | Admitting: Obstetrics & Gynecology

## 2019-07-30 DIAGNOSIS — R928 Other abnormal and inconclusive findings on diagnostic imaging of breast: Secondary | ICD-10-CM | POA: Diagnosis not present

## 2019-07-30 DIAGNOSIS — Z1231 Encounter for screening mammogram for malignant neoplasm of breast: Secondary | ICD-10-CM

## 2019-07-30 DIAGNOSIS — N644 Mastodynia: Secondary | ICD-10-CM | POA: Insufficient documentation

## 2019-08-03 DIAGNOSIS — K7689 Other specified diseases of liver: Secondary | ICD-10-CM | POA: Diagnosis not present

## 2019-08-14 DIAGNOSIS — O903 Peripartum cardiomyopathy: Secondary | ICD-10-CM | POA: Diagnosis not present

## 2019-08-14 DIAGNOSIS — I10 Essential (primary) hypertension: Secondary | ICD-10-CM | POA: Diagnosis not present

## 2019-08-14 DIAGNOSIS — E669 Obesity, unspecified: Secondary | ICD-10-CM | POA: Diagnosis not present

## 2019-08-21 DIAGNOSIS — L732 Hidradenitis suppurativa: Secondary | ICD-10-CM | POA: Diagnosis not present

## 2019-08-21 DIAGNOSIS — L821 Other seborrheic keratosis: Secondary | ICD-10-CM | POA: Diagnosis not present

## 2019-08-21 DIAGNOSIS — L918 Other hypertrophic disorders of the skin: Secondary | ICD-10-CM | POA: Diagnosis not present

## 2019-10-08 DIAGNOSIS — K7689 Other specified diseases of liver: Secondary | ICD-10-CM | POA: Diagnosis not present

## 2019-10-17 ENCOUNTER — Ambulatory Visit: Payer: BC Managed Care – PPO | Attending: Internal Medicine

## 2019-10-17 DIAGNOSIS — Z23 Encounter for immunization: Secondary | ICD-10-CM

## 2019-10-17 NOTE — Progress Notes (Signed)
   Covid-19 Vaccination Clinic  Name:  Stacie Gill    MRN: [id number] DOB: [birth year]  10/17/2019  [name] was observed post Covid-19 immunization for 15 minutes without incident. She was provided with Vaccine Information Sheet and instruction to access the V-Safe system.   [name] was instructed to call 911 with any severe reactions post vaccine: Marland Kitchen Difficulty breathing  . Swelling of face and throat  . A fast heartbeat  . A bad rash all over body  . Dizziness and weakness   Immunizations Administered    Name Date Dose VIS Date Route   Pfizer COVID-19 Vaccine 10/17/2019  5:06 PM 0.3 mL 07/18/2018 Intramuscular   Manufacturer: [place]   Lot: [id number]   [place]: [id number]

## 2019-10-31 ENCOUNTER — Other Ambulatory Visit: Payer: Self-pay

## 2019-11-02 ENCOUNTER — Encounter: Payer: Self-pay | Admitting: Internal Medicine

## 2019-11-02 ENCOUNTER — Other Ambulatory Visit: Payer: Self-pay

## 2019-11-02 ENCOUNTER — Other Ambulatory Visit: Payer: Self-pay | Admitting: Internal Medicine

## 2019-11-02 ENCOUNTER — Ambulatory Visit (INDEPENDENT_AMBULATORY_CARE_PROVIDER_SITE_OTHER): Payer: BC Managed Care – PPO | Admitting: Internal Medicine

## 2019-11-02 ENCOUNTER — Telehealth: Payer: Self-pay | Admitting: Internal Medicine

## 2019-11-02 VITALS — BP 140/88 | HR 87 | Temp 97.4°F | Ht 64.0 in | Wt 241.4 lb

## 2019-11-02 DIAGNOSIS — R590 Localized enlarged lymph nodes: Secondary | ICD-10-CM

## 2019-11-02 DIAGNOSIS — F419 Anxiety disorder, unspecified: Secondary | ICD-10-CM | POA: Diagnosis not present

## 2019-11-02 DIAGNOSIS — R7303 Prediabetes: Secondary | ICD-10-CM

## 2019-11-02 DIAGNOSIS — I10 Essential (primary) hypertension: Secondary | ICD-10-CM | POA: Diagnosis not present

## 2019-11-02 DIAGNOSIS — N281 Cyst of kidney, acquired: Secondary | ICD-10-CM | POA: Insufficient documentation

## 2019-11-02 DIAGNOSIS — E538 Deficiency of other specified B group vitamins: Secondary | ICD-10-CM | POA: Diagnosis not present

## 2019-11-02 DIAGNOSIS — Z6841 Body Mass Index (BMI) 40.0 and over, adult: Secondary | ICD-10-CM

## 2019-11-02 HISTORY — DX: Morbid (severe) obesity due to excess calories: E66.01

## 2019-11-02 HISTORY — DX: Anxiety disorder, unspecified: F41.9

## 2019-11-02 HISTORY — DX: Cyst of kidney, acquired: N28.1

## 2019-11-02 MED ORDER — AMLODIPINE BESYLATE 5 MG PO TABS: 5.0000 mg | ORAL_TABLET | Freq: Every day | ORAL | Status: DC

## 2019-11-02 MED ORDER — CYANOCOBALAMIN 1000 MCG/ML IJ SOLN: 1000.0000 ug | INTRAMUSCULAR | 0 refills | Status: DC

## 2019-11-02 NOTE — Progress Notes (Signed)
. Chief Complaint  Patient presents with  . Follow-up  . Medication Management    patient had her medications mixed up and was taking meloxicam daily in place of her amlodipine/ She is not sure how long she has been doing this but noticed this morning.    F/u  1. HTN BP elevated on norvasc 5 mg, toprol 25 mg qd, altace 20 mg qd BP elevated but since Monday not on norvasc accidentally taking mobic  142/100 at [facility] appt 07/2019 then 128/84 09/2019 GI appt  2. Anxiety increased GAD 10 calling EAP therapy today. Work is stress nothing tried, having GI upset with anxiety and mostly work related declines meds for now 3. Cervical lymphadenopathy right side persistent will order US neck  4. Prediabetes A1C 5.7 07/18/19 reviewed with pt also had CMET, CBC, TSH, and B12 done 171 low will have take B12 1000 mcg      Review of Systems  Constitutional: Negative for weight loss.  HENT: Negative for hearing loss.   Eyes: Negative for blurred vision.  Respiratory: Negative for shortness of breath.   Cardiovascular: Negative for chest pain.  Gastrointestinal: Negative for abdominal pain.  Skin: Negative for rash.  Neurological: Negative for headaches.  Endo/Heme/Allergies:       +lymph node x 1 right neck   Psychiatric/Behavioral: The patient is nervous/anxious.    Past Medical History:  Diagnosis Date  . Cardiomyopathy ([place])   . Chickenpox   . Fibroids   . Focal nodular hyperplasia of liver   . GERD (gastroesophageal reflux disease)   . Hypertension   . Prediabetes   . Strep throat   . UTI (lower urinary tract infection)    Past Surgical History:  Procedure Laterality Date  . CHOLECYSTECTOMY  2003   Family History  Problem Relation Age of Onset  . Heart disease Mother   . Prostate cancer Father   . Alcoholism Other        Uncle  . Arthritis Other        Grandparent  . Colon cancer Other        Great grandparent, great uncle  . Uterine cancer Maternal Aunt   . Breast cancer  Cousin   . Stroke Other        Great grandparent  . Hypertension Other        Parent  . Bone cancer Other        Uncle  . Healthy Daughter    Social History   Socioeconomic History  . Marital status: Married    Spouse name: Not on file  . Number of children: Not on file  . Years of education: Not on file  . Highest education level: Not on file  Occupational History  . Not on file  Tobacco Use  . Smoking status: Never Smoker  . Smokeless tobacco: Never Used  Substance and Sexual Activity  . Alcohol use: Yes    Alcohol/week: 0.0 standard drinks    Comment: 1 a month, rare  . Drug use: No  . Sexual activity: Not on file  Other Topics Concern  . Not on file  Social History Narrative   Lives with husband and 2 children in a one story home.     Works at call center.  Education: associates degree.   Husband as of 05/02/19 on dialysis    Social Determinants of Health   Financial Resource Strain:   . Difficulty of Paying Living Expenses:   Food Insecurity:   .  Worried About Charity fundraiser in the Last Year:   . Arboriculturist in the Last Year:   Transportation Needs:   . Film/video editor (Medical):   Marland Kitchen Lack of Transportation (Non-Medical):   Physical Activity:   . Days of Exercise per Week:   . Minutes of Exercise per Session:   Stress:   . Feeling of Stress :   Social Connections:   . Frequency of Communication with Friends and Family:   . Frequency of Social Gatherings with Friends and Family:   . Attends Religious Services:   . Active Member of Clubs or Organizations:   . Attends Archivist Meetings:   Marland Kitchen Marital Status:   Intimate Partner Violence:   . Fear of Current or Ex-Partner:   . Emotionally Abused:   Marland Kitchen Physically Abused:   . Sexually Abused:    Current Meds  Medication Sig  . amLODipine (NORVASC) 5 MG tablet Take 1 tablet (5 mg total) by mouth daily.  . clindamycin (CLEOCIN T) 1 % lotion Apply topically 2 (two) times daily. Bid  under arms  . Esomeprazole Magnesium (NEXIUM PO) Take by mouth as needed.  . fexofenadine (ALLEGRA) 180 MG tablet Take 180 mg by mouth daily.  . fluticasone (FLONASE) 50 MCG/ACT nasal spray Place into both nostrils daily.  Marland Kitchen levonorgestrel (MIRENA) 20 MCG/24HR IUD 1 each by Intrauterine route once.  . metoprolol succinate (TOPROL-XL) 25 MG 24 hr tablet Take 1 tablet (25 mg total) by mouth daily.  . ramipril (ALTACE) 10 MG capsule Take 2 capsules (20 mg total) by mouth daily.  . valACYclovir (VALTREX) 500 MG tablet Take by mouth.  . [DISCONTINUED] amLODipine (NORVASC) 2.5 MG tablet Take 1 tablet (2.5 mg total) by mouth daily. (Patient taking differently: Take 5 mg by mouth daily. )   Allergies  Allergen Reactions  . Coreg [Carvedilol]     sob   No results found for this or any previous visit (from the past 2160 hour(s)). Objective  Body mass index is 41.44 kg/m. Wt Readings from Last 3 Encounters:  11/02/19 241 lb 6.4 oz (109.5 kg)  05/02/19 235 lb (106.6 kg)  06/27/18 227 lb 6 oz (103.1 kg)   Temp Readings from Last 3 Encounters:  11/02/19 (!) 97.4 F (36.3 C) (Temporal)  06/27/18 98.5 F (36.9 C) (Oral)  06/21/18 (!) 101.1 F (38.4 C) (Oral)   BP Readings from Last 3 Encounters:  11/02/19 140/88  05/02/19 130/90  06/27/18 130/80   Pulse Readings from Last 3 Encounters:  11/02/19 87  06/27/18 84  06/21/18 (!) 122    Physical Exam Vitals and nursing note reviewed.  Constitutional:      Appearance: Normal appearance. She is well-developed and well-groomed. She is morbidly obese.  HENT:     Head: Normocephalic and atraumatic.  Eyes:     Conjunctiva/sclera: Conjunctivae normal.     Pupils: Pupils are equal, round, and reactive to light.  Cardiovascular:     Rate and Rhythm: Normal rate and regular rhythm.     Heart sounds: Normal heart sounds. No murmur heard.   Pulmonary:     Effort: Pulmonary effort is normal.     Breath sounds: Normal breath sounds.  Skin:     General: Skin is warm and dry.  Neurological:     General: No focal deficit present.     Mental Status: She is alert and oriented to person, place, and time. Mental status is at baseline.  Gait: Gait normal.  Psychiatric:        Attention and Perception: Attention and perception normal.        Mood and Affect: Mood and affect normal.        Speech: Speech normal.        Behavior: Behavior normal. Behavior is cooperative.        Thought Content: Thought content normal.        Cognition and Memory: Cognition and memory normal.        Judgment: Judgment normal.     Assessment  Plan  Essential hypertension - Plan: amLODipine (NORVASC) 5 MG tablet Cont other meds  Monitor BP goal <130/<80  Dash diet   Cervical lymphadenopathy - Plan: US SOFT TISSUE HEAD & NECK (NON-THYROID)  Anxiety l theanine and rec therapy  Declines meds for now  Vitamin B12 deficiency 171 1000 mcg qd   Prediabetes Given diet info   Morbid obesity with BMI of 40.0-44.9, adult (HCC) Class 3 severe obesity due to excess calories with serious comorbidity and body mass index (BMI) of 40.0 to 44.9 in adult [facility])  rec exercise and healthy diet choices   HM Declines flu shot Tdap and twinrix discprev mmr immune covid 1/2   OB/GYN appt sch for f/u ovarian cyst, fibroids now with BV as of 06/14/2018 [facility] -pap 06/02/17 neg pap neg HPV  mammo 2/7/2020neg, 07/30/19 negative   Colonoscopy disc age 13 est [facility]  Reviewed labs 07/18/19   Vit D 06/16/17 15.2 on 50K now off rec D3 4000 IU qd  Neg HIV 06/14/2018, CMET, lipid, TSH, B12, vitamin D, GC/G from 06/14/2018 A1C 5.7 06/14/2018  CT chest 06/20/2017 1.9 cm liver lesion 0.2 cm right lower lobe pulm nodule consider repeat CT chest in 1 year CT chest 08/18/18 likely postinfectious benign nodules no change   MRI had ab/pelvis 08/03/19   Of note B12 06/14/18 212, vitamin D 06/14/18 35.9 with h/o low vitmain D and B12   Provider: Dr. Olivia Mackie  [facility]

## 2019-11-02 NOTE — Telephone Encounter (Signed)
Patient informed and verbalized understanding.   States her OBGYN is treating her for this. She will be starting injections Bi weekly and then she will switch to monthly.

## 2019-11-02 NOTE — Patient Instructions (Addendum)
Nature made L theanine 100-200 mg at night   Exercise   Replika, Insight timer,  Bloom, Calm, Headspace   Goal blood pressure <130/<80  Let me know if higher and check BP 2 hours after meds   Generalized Anxiety Disorder, Adult Generalized anxiety disorder (GAD) is a mental health disorder. People with this condition constantly worry about everyday events. Unlike normal anxiety, worry related to GAD is not triggered by a specific event. These worries also do not fade or get better with time. GAD interferes with life functions, including relationships, work, and school. GAD can vary from mild to severe. People with severe GAD can have intense waves of anxiety with physical symptoms (panic attacks). What are the causes? The exact cause of GAD is not known. What increases the risk? This condition is more likely to develop in:  Women.  People who have a family history of anxiety disorders.  People who are very shy.  People who experience very stressful life events, such as the death of a loved one.  People who have a very stressful family environment. What are the signs or symptoms? People with GAD often worry excessively about many things in their lives, such as their health and family. They may also be overly concerned about:  Doing well at work.  Being on time.  Natural disasters.  Friendships. Physical symptoms of GAD include:  Fatigue.  Muscle tension or having muscle twitches.  Trembling or feeling shaky.  Being easily startled.  Feeling like your heart is pounding or racing.  Feeling out of breath or like you cannot take a deep breath.  Having trouble falling asleep or staying asleep.  Sweating.  Nausea, diarrhea, or irritable bowel syndrome (IBS).  Headaches.  Trouble concentrating or remembering facts.  Restlessness.  Irritability. How is this diagnosed? Your health care provider can diagnose GAD based on your symptoms and medical history. You  will also have a physical exam. The health care provider will ask specific questions about your symptoms, including how severe they are, when they started, and if they come and go. Your health care provider may ask you about your use of alcohol or drugs, including prescription medicines. Your health care provider may refer you to a mental health specialist for further evaluation. Your health care provider will do a thorough examination and may perform additional tests to rule out other possible causes of your symptoms. To be diagnosed with GAD, a person must have anxiety that:  Is out of his or her control.  Affects several different aspects of his or her life, such as work and relationships.  Causes distress that makes him or her unable to take part in normal activities.  Includes at least three physical symptoms of GAD, such as restlessness, fatigue, trouble concentrating, irritability, muscle tension, or sleep problems. Before your health care provider can confirm a diagnosis of GAD, these symptoms must be present more days than they are not, and they must last for six months or longer. How is this treated? The following therapies are usually used to treat GAD:  Medicine. Antidepressant medicine is usually prescribed for long-term daily control. Antianxiety medicines may be added in severe cases, especially when panic attacks occur.  Talk therapy (psychotherapy). Certain types of talk therapy can be helpful in treating GAD by providing support, education, and guidance. Options include: ? Cognitive behavioral therapy (CBT). People learn coping skills and techniques to ease their anxiety. They learn to identify unrealistic or negative thoughts and behaviors and  to replace them with positive ones. ? Acceptance and commitment therapy (ACT). This treatment teaches people how to be mindful as a way to cope with unwanted thoughts and feelings. ? Biofeedback. This process trains you to manage your  body's response (physiological response) through breathing techniques and relaxation methods. You will work with a therapist while machines are used to monitor your physical symptoms.  Stress management techniques. These include yoga, meditation, and exercise. A mental health specialist can help determine which treatment is best for you. Some people see improvement with one type of therapy. However, other people require a combination of therapies. Follow these instructions at home:  Take over-the-counter and prescription medicines only as told by your health care provider.  Try to maintain a normal routine.  Try to anticipate stressful situations and allow extra time to manage them.  Practice any stress management or self-calming techniques as taught by your health care provider.  Do not punish yourself for setbacks or for not making progress.  Try to recognize your accomplishments, even if they are small.  Keep all follow-up visits as told by your health care provider. This is important. Contact a health care provider if:  Your symptoms do not get better.  Your symptoms get worse.  You have signs of depression, such as: ? A persistently sad, cranky, or irritable mood. ? Loss of enjoyment in activities that used to bring you joy. ? Change in weight or eating. ? Changes in sleeping habits. ? Avoiding friends or family members. ? Loss of energy for normal tasks. ? Feelings of guilt or worthlessness. Get help right away if:  You have serious thoughts about hurting yourself or others. If you ever feel like you may hurt yourself or others, or have thoughts about taking your own life, get help right away. You can go to your nearest emergency department or call:  Your local emergency services (911 in the [place]  A suicide crisis helpline, such as the [place] at (438)680-9671. This is open 24 hours a day. Summary  Generalized anxiety disorder (GAD) is  a mental health disorder that involves worry that is not triggered by a specific event.  People with GAD often worry excessively about many things in their lives, such as their health and family.  GAD may cause physical symptoms such as restlessness, trouble concentrating, sleep problems, frequent sweating, nausea, diarrhea, headaches, and trembling or muscle twitching.  A mental health specialist can help determine which treatment is best for you. Some people see improvement with one type of therapy. However, other people require a combination of therapies. This information is not intended to replace advice given to you by your health care provider. Make sure you discuss any questions you have with your health care provider. Document Revised: 04/22/2017 Document Reviewed: 03/30/2016 Elsevier Patient Education  [place].  Mindfulness-Based Stress Reduction Mindfulness-based stress reduction (MBSR) is a program that helps people learn to practice mindfulness. Mindfulness is the practice of intentionally paying attention to the present moment. It can be learned and practiced through techniques such as education, breathing exercises, meditation, and yoga. MBSR includes several mindfulness techniques in one program. MBSR works best when you understand the treatment, are willing to try new things, and can commit to spending time practicing what you learn. MBSR training may include learning about:  How your emotions, thoughts, and reactions affect your body.  New ways to respond to things that cause negative thoughts to start (triggers).  How to  notice your thoughts and let go of them.  Practicing awareness of everyday things that you normally do without thinking.  The techniques and goals of different types of meditation. What are the benefits of MBSR? MBSR can have many benefits, which include helping you to:  Develop self-awareness. This refers to knowing and understanding  yourself.  Learn skills and attitudes that help you to participate in your own health care.  Learn new ways to care for yourself.  Be more accepting about how things are, and let things go.  Be less judgmental and approach things with an open mind.  Be patient with yourself and trust yourself more. MBSR has also been shown to:  Reduce negative emotions, such as depression and anxiety.  Improve memory and focus.  Change how you sense and approach pain.  Boost your body's ability to fight infections.  Help you connect better with other people.  Improve your sense of well-being. Follow these instructions at home:   Find a local in-person or online MBSR program.  Set aside some time regularly for mindfulness practice.  Find a mindfulness practice that works best for you. This may include one or more of the following: ? Meditation. Meditation involves focusing your mind on a certain thought or activity. ? Breathing awareness exercises. These help you to stay present by focusing on your breath. ? Body scan. For this practice, you lie down and pay attention to each part of your body from head to toe. You can identify tension and soreness and intentionally relax parts of your body. ? Yoga. Yoga involves stretching and breathing, and it can improve your ability to move and be flexible. It can also provide an experience of testing your body's limits, which can help you release stress. ? Mindful eating. This way of eating involves focusing on the taste, texture, color, and smell of each bite of food. Because this slows down eating and helps you feel full sooner, it can be an important part of a weight-loss plan.  Find a podcast or recording that provides guidance for breathing awareness, body scan, or meditation exercises. You can listen to these any time when you have a free moment to rest without distractions.  Follow your treatment plan as told by your health care provider. This may  include taking regular medicines and making changes to your diet or lifestyle as recommended. How to practice mindfulness To do a basic awareness exercise:  Find a comfortable place to sit.  Pay attention to the present moment. Observe your thoughts, feelings, and surroundings just as they are.  Avoid placing judgment on yourself, your feelings, or your surroundings. Make note of any judgment that comes up, and let it go.  Your mind may wander, and that is okay. Make note of when your thoughts drift, and return your attention to the present moment. To do basic mindfulness meditation:  Find a comfortable place to sit. This may include a stable chair or a firm floor cushion. ? Sit upright with your back straight. Let your arms fall next to your side with your hands resting on your legs. ? If sitting in a chair, rest your feet flat on the floor. ? If sitting on a cushion, cross your legs in front of you.  Keep your head in a neutral position with your chin dropped slightly. Relax your jaw and rest the tip of your tongue on the roof of your mouth. Drop your gaze to the floor. You can close your eyes  if you like.  Breathe normally and pay attention to your breath. Feel the air moving in and out of your nose. Feel your belly expanding and relaxing with each breath.  Your mind may wander, and that is okay. Make note of when your thoughts drift, and return your attention to your breath.  Avoid placing judgment on yourself, your feelings, or your surroundings. Make note of any judgment or feelings that come up, let them go, and bring your attention back to your breath.  When you are ready, lift your gaze or open your eyes. Pay attention to how your body feels after the meditation. Where to find more information You can find more information about MBSR from:  Your health care provider.  Community-based meditation centers or programs.  Programs offered near you. Summary  Mindfulness-based  stress reduction (MBSR) is a program that teaches you how to intentionally pay attention to the present moment. It is used with other treatments to help you cope better with daily stress, emotions, and pain.  MBSR focuses on developing self-awareness, which allows you to respond to life stress without judgment or negative emotions.  MBSR programs may involve learning different mindfulness practices, such as breathing exercises, meditation, yoga, body scan, or mindful eating. Find a mindfulness practice that works best for you, and set aside time for it on a regular basis. This information is not intended to replace advice given to you by your health care provider. Make sure you discuss any questions you have with your health care provider. Document Revised: 04/22/2017 Document Reviewed: 09/16/2016 Elsevier Patient Education  [place] DASH stands for "Dietary Approaches to Stop Hypertension." The DASH eating plan is a healthy eating plan that has been shown to reduce high blood pressure (hypertension). It may also reduce your risk for type 2 diabetes, heart disease, and stroke. The DASH eating plan may also help with weight loss. What are tips for following this plan?  General guidelines  Avoid eating more than 2,300 mg (milligrams) of salt (sodium) a day. If you have hypertension, you may need to reduce your sodium intake to 1,500 mg a day.  Limit alcohol intake to no more than 1 drink a day for nonpregnant women and 2 drinks a day for men. One drink equals 12 oz of beer, 5 oz of wine, or 1 oz of hard liquor.  Work with your health care provider to maintain a healthy body weight or to lose weight. Ask what an ideal weight is for you.  Get at least 30 minutes of exercise that causes your heart to beat faster (aerobic exercise) most days of the week. Activities may include walking, swimming, or biking.  Work with your health care provider or diet and nutrition  specialist (dietitian) to adjust your eating plan to your individual calorie needs. Reading food labels   Check food labels for the amount of sodium per serving. Choose foods with less than 5 percent of the Daily Value of sodium. Generally, foods with less than 300 mg of sodium per serving fit into this eating plan.  To find whole grains, look for the word "whole" as the first word in the ingredient list. Shopping  Buy products labeled as "low-sodium" or "no salt added."  Buy fresh foods. Avoid canned foods and premade or frozen meals. Cooking  Avoid adding salt when cooking. Use salt-free seasonings or herbs instead of table salt or sea salt. Check with your health care provider or pharmacist  before using salt substitutes.  Do not fry foods. Cook foods using healthy methods such as baking, boiling, grilling, and broiling instead.  Cook with heart-healthy oils, such as olive, canola, soybean, or sunflower oil. Meal planning  Eat a balanced diet that includes: ? 5 or more servings of fruits and vegetables each day. At each meal, try to fill half of your plate with fruits and vegetables. ? Up to 6-8 servings of whole grains each day. ? Less than 6 oz of lean meat, poultry, or fish each day. A 3-oz serving of meat is about the same size as a deck of cards. One egg equals 1 oz. ? 2 servings of low-fat dairy each day. ? A serving of nuts, seeds, or beans 5 times each week. ? Heart-healthy fats. Healthy fats called Omega-3 fatty acids are found in foods such as flaxseeds and coldwater fish, like sardines, salmon, and mackerel.  Limit how much you eat of the following: ? Canned or prepackaged foods. ? Food that is high in trans fat, such as fried foods. ? Food that is high in saturated fat, such as fatty meat. ? Sweets, desserts, sugary drinks, and other foods with added sugar. ? Full-fat dairy products.  Do not salt foods before eating.  Try to eat at least 2 vegetarian meals each  week.  Eat more home-cooked food and less restaurant, buffet, and fast food.  When eating at a restaurant, ask that your food be prepared with less salt or no salt, if possible. What foods are recommended? The items listed may not be a complete list. Talk with your dietitian about what dietary choices are best for you. Grains Whole-grain or whole-wheat bread. Whole-grain or whole-wheat pasta. Brown rice. [place]. Bulgur. Whole-grain and low-sodium cereals. Pita bread. Low-fat, low-sodium crackers. Whole-wheat flour tortillas. Vegetables Fresh or frozen vegetables (raw, steamed, roasted, or grilled). Low-sodium or reduced-sodium tomato and vegetable juice. Low-sodium or reduced-sodium tomato sauce and tomato paste. Low-sodium or reduced-sodium canned vegetables. Fruits All fresh, dried, or frozen fruit. Canned fruit in natural juice (without added sugar). Meat and other protein foods Skinless chicken or [place]. Ground chicken or [place]. Pork with fat trimmed off. Fish and seafood. Egg whites. Dried beans, peas, or lentils. Unsalted nuts, nut butters, and seeds. Unsalted canned beans. Lean cuts of beef with fat trimmed off. Low-sodium, lean deli meat. Dairy Low-fat (1%) or fat-free (skim) milk. Fat-free, low-fat, or reduced-fat cheeses. Nonfat, low-sodium ricotta or cottage cheese. Low-fat or nonfat yogurt. Low-fat, low-sodium cheese. Fats and oils Soft margarine without trans fats. Vegetable oil. Low-fat, reduced-fat, or light mayonnaise and salad dressings (reduced-sodium). Canola, safflower, olive, soybean, and sunflower oils. Avocado. Seasoning and other foods Herbs. Spices. Seasoning mixes without salt. Unsalted popcorn and pretzels. Fat-free sweets. What foods are not recommended? The items listed may not be a complete list. Talk with your dietitian about what dietary choices are best for you. Grains Baked goods made with fat, such as croissants, muffins, or some breads. Dry pasta  or rice meal packs. Vegetables Creamed or fried vegetables. Vegetables in a cheese sauce. Regular canned vegetables (not low-sodium or reduced-sodium). Regular canned tomato sauce and paste (not low-sodium or reduced-sodium). Regular tomato and vegetable juice (not low-sodium or reduced-sodium). Angie Fava. Olives. Fruits Canned fruit in a light or heavy syrup. Fried fruit. Fruit in cream or butter sauce. Meat and other protein foods Fatty cuts of meat. Ribs. Fried meat. Berniece Salines. Sausage. Bologna and other processed lunch meats. Salami. Fatback. Hotdogs. Bratwurst. Salted nuts  and seeds. Canned beans with added salt. Canned or smoked fish. Whole eggs or egg yolks. Chicken or [place] with skin. Dairy Whole or 2% milk, cream, and half-and-half. Whole or full-fat cream cheese. Whole-fat or sweetened yogurt. Full-fat cheese. Nondairy creamers. Whipped toppings. Processed cheese and cheese spreads. Fats and oils Butter. Stick margarine. Lard. Shortening. Ghee. Bacon fat. Tropical oils, such as coconut, palm kernel, or palm oil. Seasoning and other foods Salted popcorn and pretzels. Onion salt, garlic salt, seasoned salt, table salt, and sea salt. Worcestershire sauce. Tartar sauce. Barbecue sauce. Teriyaki sauce. Soy sauce, including reduced-sodium. Steak sauce. Canned and packaged gravies. Fish sauce. Oyster sauce. Cocktail sauce. Horseradish that you find on the shelf. Ketchup. Mustard. Meat flavorings and tenderizers. Bouillon cubes. Hot sauce and Tabasco sauce. Premade or packaged marinades. Premade or packaged taco seasonings. Relishes. Regular salad dressings. Where to find more information:  [employer] [facility] [place]: https://wilson-eaton.com/  American Heart Association: www.heart.org Summary  The DASH eating plan is a healthy eating plan that has been shown to reduce high blood pressure (hypertension). It may also reduce your risk for type 2 diabetes, heart disease, and stroke.  With the  DASH eating plan, you should limit salt (sodium) intake to 2,300 mg a day. If you have hypertension, you may need to reduce your sodium intake to 1,500 mg a day.  When on the DASH eating plan, aim to eat more fresh fruits and vegetables, whole grains, lean proteins, low-fat dairy, and heart-healthy fats.  Work with your health care provider or diet and nutrition specialist (dietitian) to adjust your eating plan to your individual calorie needs. This information is not intended to replace advice given to you by your health care provider. Make sure you discuss any questions you have with your health care provider. Document Revised: 04/22/2017 Document Reviewed: 05/03/2016 Elsevier Patient Education  2020 [place].    Prediabetes Eating Plan Prediabetes is a condition that causes blood sugar (glucose) levels to be higher than normal. This increases the risk for developing diabetes. In order to prevent diabetes from developing, your health care provider may recommend a diet and other lifestyle changes to help you:  Control your blood glucose levels.  Improve your cholesterol levels.  Manage your blood pressure. Your health care provider may recommend working with a diet and nutrition specialist (dietitian) to make a meal plan that is best for you. What are tips for following this plan? Lifestyle  Set weight loss goals with the help of your health care team. It is recommended that most people with prediabetes lose 7% of their current body weight.  Exercise for at least 30 minutes at least 5 days a week.  Attend a support group or seek ongoing support from a mental health counselor.  Take over-the-counter and prescription medicines only as told by your health care provider. Reading food labels  Read food labels to check the amount of fat, salt (sodium), and sugar in prepackaged foods. Avoid foods that have: ? Saturated fats. ? Trans fats. ? Added sugars.  Avoid foods that have  more than 300 milligrams (mg) of sodium per serving. Limit your daily sodium intake to less than 2,300 mg each day. Shopping  Avoid buying pre-made and processed foods. Cooking  Cook with olive oil. Do not use butter, lard, or ghee.  Bake, broil, grill, or boil foods. Avoid frying. Meal planning   Work with your dietitian to develop an eating plan that is right for you. This may  include: ? Tracking how many calories you take in. Use a food diary, notebook, or mobile application to track what you eat at each meal. ? Using the glycemic index (GI) to plan your meals. The index tells you how quickly a food will raise your blood glucose. Choose low-GI foods. These foods take a longer time to raise blood glucose.  Consider following a Mediterranean diet. This diet includes: ? Several servings each day of fresh fruits and vegetables. ? Eating fish at least twice a week. ? Several servings each day of whole grains, beans, nuts, and seeds. ? Using olive oil instead of other fats. ? Moderate alcohol consumption. ? Eating small amounts of red meat and whole-fat dairy.  If you have high blood pressure, you may need to limit your sodium intake or follow a diet such as the DASH eating plan. DASH is an eating plan that aims to lower high blood pressure. What foods are recommended? The items listed below may not be a complete list. Talk with your dietitian about what dietary choices are best for you. Grains Whole grains, such as whole-wheat or whole-grain breads, crackers, cereals, and pasta. Unsweetened oatmeal. Bulgur. Barley. Quinoa. Brown rice. Corn or whole-wheat flour tortillas or taco shells. Vegetables Lettuce. Spinach. Peas. Beets. Cauliflower. Cabbage. Broccoli. Carrots. Tomatoes. Squash. Eggplant. Herbs. Peppers. Onions. Cucumbers. Brussels sprouts. Fruits Berries. Bananas. Apples. Oranges. Grapes. Papaya. Mango. Pomegranate. Kiwi. Grapefruit. Cherries. Meats and other protein  foods Seafood. Poultry without skin. Lean cuts of pork and beef. Tofu. Eggs. Nuts. Beans. Dairy Low-fat or fat-free dairy products, such as yogurt, cottage cheese, and cheese. Beverages Water. Tea. Coffee. Sugar-free or diet soda. Seltzer water. Lowfat or no-fat milk. Milk alternatives, such as soy or almond milk. Fats and oils Olive oil. Canola oil. Sunflower oil. Grapeseed oil. Avocado. Walnuts. Sweets and desserts Sugar-free or low-fat pudding. Sugar-free or low-fat ice cream and other frozen treats. Seasoning and other foods Herbs. Sodium-free spices. Mustard. Relish. Low-fat, low-sugar ketchup. Low-fat, low-sugar barbecue sauce. Low-fat or fat-free mayonnaise. What foods are not recommended? The items listed below may not be a complete list. Talk with your dietitian about what dietary choices are best for you. Grains Refined white flour and flour products, such as bread, pasta, snack foods, and cereals. Vegetables Canned vegetables. Frozen vegetables with butter or cream sauce. Fruits Fruits canned with syrup. Meats and other protein foods Fatty cuts of meat. Poultry with skin. Breaded or fried meat. Processed meats. Dairy Full-fat yogurt, cheese, or milk. Beverages Sweetened drinks, such as sweet iced tea and soda. Fats and oils Butter. Lard. Ghee. Sweets and desserts Baked goods, such as cake, cupcakes, pastries, cookies, and cheesecake. Seasoning and other foods Spice mixes with added salt. Ketchup. Barbecue sauce. Mayonnaise. Summary  To prevent diabetes from developing, you may need to make diet and other lifestyle changes to help control blood sugar, improve cholesterol levels, and manage your blood pressure.  Set weight loss goals with the help of your health care team. It is recommended that most people with prediabetes lose 7 percent of their current body weight.  Consider following a Mediterranean diet that includes plenty of fresh fruits and vegetables, whole  grains, beans, nuts, seeds, fish, lean meat, low-fat dairy, and healthy oils. This information is not intended to replace advice given to you by your health care provider. Make sure you discuss any questions you have with your health care provider. Document Revised: 09/01/2018 Document Reviewed: 07/14/2016 Elsevier Patient Education  2020 [employer].

## 2019-11-02 NOTE — Telephone Encounter (Signed)
B12 low labs 06/2019 [facility] please take otc B12 1000 mcg daily

## 2019-11-05 DIAGNOSIS — E538 Deficiency of other specified B group vitamins: Secondary | ICD-10-CM | POA: Diagnosis not present

## 2019-11-07 ENCOUNTER — Ambulatory Visit: Payer: BC Managed Care – PPO | Attending: Internal Medicine

## 2019-11-07 DIAGNOSIS — Z23 Encounter for immunization: Secondary | ICD-10-CM

## 2019-11-07 NOTE — Progress Notes (Signed)
   Covid-19 Vaccination Clinic  Name:  Stacie Gill    MRN: [id number] DOB: 05/09/1976  11/07/2019  Stacie Gill was observed post Covid-19 immunization for 15 minutes without incident. She was provided with Vaccine Information Sheet and instruction to access the V-Safe system.   Stacie Gill was instructed to call 911 with any severe reactions post vaccine: Marland Kitchen Difficulty breathing  . Swelling of face and throat  . A fast heartbeat  . A bad rash all over body  . Dizziness and weakness   Immunizations Administered    Name Date Dose VIS Date Route   Pfizer COVID-19 Vaccine 11/07/2019  5:37 PM 0.3 mL 07/18/2018 Intramuscular   Manufacturer: [place]   Lot: [id number]   [place]: [id number]

## 2019-11-23 ENCOUNTER — Other Ambulatory Visit: Payer: Self-pay

## 2019-11-23 ENCOUNTER — Ambulatory Visit
Admission: RE | Admit: 2019-11-23 | Discharge: 2019-11-23 | Disposition: A | Discharge status: Home / Self Care | Payer: BC Managed Care – PPO | Source: Ambulatory Visit | Attending: Internal Medicine | Admitting: Internal Medicine

## 2019-11-23 DIAGNOSIS — R590 Localized enlarged lymph nodes: Secondary | ICD-10-CM | POA: Insufficient documentation

## 2020-05-09 ENCOUNTER — Ambulatory Visit (INDEPENDENT_AMBULATORY_CARE_PROVIDER_SITE_OTHER): Payer: BC Managed Care – PPO | Admitting: Internal Medicine

## 2020-05-09 ENCOUNTER — Other Ambulatory Visit: Payer: Self-pay

## 2020-05-09 ENCOUNTER — Encounter: Payer: Self-pay | Admitting: Internal Medicine

## 2020-05-09 VITALS — BP 138/94 | HR 93 | Temp 98.4°F | Ht 64.0 in | Wt 240.1 lb

## 2020-05-09 DIAGNOSIS — E538 Deficiency of other specified B group vitamins: Secondary | ICD-10-CM | POA: Diagnosis not present

## 2020-05-09 DIAGNOSIS — R7303 Prediabetes: Secondary | ICD-10-CM | POA: Diagnosis not present

## 2020-05-09 DIAGNOSIS — F419 Anxiety disorder, unspecified: Secondary | ICD-10-CM

## 2020-05-09 DIAGNOSIS — I10 Essential (primary) hypertension: Secondary | ICD-10-CM | POA: Diagnosis not present

## 2020-05-09 DIAGNOSIS — Z6841 Body Mass Index (BMI) 40.0 and over, adult: Secondary | ICD-10-CM

## 2020-05-09 NOTE — Patient Instructions (Addendum)
[facility] [place] therapy  Pfizer mid 05/2020  Call back for Tdap  10/08/2019 Office Visit [facility]  [place], [place] [address]  [phone]  Bonnielee Haff, [address]  [place], [place] [address]  [phone]  [phone] (Fax)    MRI due 07/2020 discuss colonosocpy with GI   08/14/2019 Office Visit [facility]  [address]  [place], [place] [address]  [phone]  Lolita Patella, MD  [facility] [address]  [place], [place] [address]  [phone]  [phone] (Fax)  Essential hypertension (Primary Dx);  Postpartum cardiomyopathy;  Obesity (BMI 30-39.9), unspecified   Consider norvasc increase to 10 mg daily if BP >130/>80  Goal/BP <130/80  Labs for the year due 07/17/20 Dr Leonides Schanz   Consider celexa daily for anxiety  Remember to get B12 shots every 30 days  Managing Anxiety, Adult After being diagnosed with an anxiety disorder, you may be relieved to know why you have felt or behaved a certain way. You may also feel overwhelmed about the treatment ahead and what it will mean for your life. With care and support, you can manage this condition and recover from it. How to manage lifestyle changes Managing stress and anxiety  Stress is your body's reaction to life changes and events, both good and bad. Most stress will last just a few hours, but stress can be ongoing and can lead to more than just stress. Although stress can play a major role in anxiety, it is not the same as anxiety. Stress is usually caused by something external, such as a deadline, test, or competition. Stress normally passes after the triggering event has ended.  Anxiety is caused by something internal, such as imagining a terrible outcome or worrying that something will go wrong that will devastate you. Anxiety often does not go away even after the triggering event is over, and it can become long-term  (chronic) worry. It is important to understand the differences between stress and anxiety and to manage your stress effectively so that it does not lead to an anxious response. Talk with your health care provider or a counselor to learn more about reducing anxiety and stress. He or she may suggest tension reduction techniques, such as:  Music therapy. This can include creating or listening to music that you enjoy and that inspires you.  Mindfulness-based meditation. This involves being aware of your normal breaths while not trying to control your breathing. It can be done while sitting or walking.  Centering prayer. This involves focusing on a word, phrase, or sacred image that means something to you and brings you peace.  Deep breathing. To do this, expand your stomach and inhale slowly through your nose. Hold your breath for 3-5 seconds. Then exhale slowly, letting your stomach muscles relax.  Self-talk. This involves identifying thought patterns that lead to anxiety reactions and changing those patterns.  Muscle relaxation. This involves tensing muscles and then relaxing them. Choose a tension reduction technique that suits your lifestyle and personality. These techniques take time and practice. Set aside 5-15 minutes a day to do them. Therapists can offer counseling and training in these techniques. The training to help with anxiety may be covered by some insurance plans. Other things you can do to manage stress and anxiety include:  Keeping a stress/anxiety diary. This can help you learn what triggers your reaction and then learn ways to  manage your response.  Thinking about how you react to certain situations. You may not be able to control everything, but you can control your response.  Making time for activities that help you relax and not feeling guilty about spending your time in this way.  Visual imagery and yoga can help you stay calm and relax.  Medicines Medicines can help ease  symptoms. Medicines for anxiety include:  Anti-anxiety drugs.  Antidepressants. Medicines are often used as a primary treatment for anxiety disorder. Medicines will be prescribed by a health care provider. When used together, medicines, psychotherapy, and tension reduction techniques may be the most effective treatment. Relationships Relationships can play a big part in helping you recover. Try to spend more time connecting with trusted friends and family members. Consider going to couples counseling, taking family education classes, or going to family therapy. Therapy can help you and others better understand your condition. How to recognize changes in your anxiety Everyone responds differently to treatment for anxiety. Recovery from anxiety happens when symptoms decrease and stop interfering with your daily activities at home or work. This may mean that you will start to:  Have better concentration and focus. Worry will interfere less in your daily thinking.  Sleep better.  Be less irritable.  Have more energy.  Have improved memory. It is important to recognize when your condition is getting worse. Contact your health care provider if your symptoms interfere with home or work and you feel like your condition is not improving. Follow these instructions at home: Activity  Exercise. Most adults should do the following: ? Exercise for at least 150 minutes each week. The exercise should increase your heart rate and make you sweat (moderate-intensity exercise). ? Strengthening exercises at least twice a week.  Get the right amount and quality of sleep. Most adults need 7-9 hours of sleep each night. Lifestyle   Eat a healthy diet that includes plenty of vegetables, fruits, whole grains, low-fat dairy products, and lean protein. Do not eat a lot of foods that are high in solid fats, added sugars, or salt.  Make choices that simplify your life.  Do not use any products that contain  nicotine or tobacco, such as cigarettes, e-cigarettes, and chewing tobacco. If you need help quitting, ask your health care provider.  Avoid caffeine, alcohol, and certain over-the-counter cold medicines. These may make you feel worse. Ask your pharmacist which medicines to avoid. General instructions  Take over-the-counter and prescription medicines only as told by your health care provider.  Keep all follow-up visits as told by your health care provider. This is important. Where to find support You can get help and support from these sources:  Self-help groups.  Online and [employer].  A trusted spiritual leader.  Couples counseling.  Family education classes.  Family therapy. Where to find more information You may find that joining a support group helps you deal with your anxiety. The following sources can help you locate counselors or support groups near you:  Man: www.mentalhealthamerica.net  Anxiety and Depression Association of [place] (ADAA): https://www.clark.net/  National Alliance on Mental Illness (NAMI): www.nami.org Contact a health care provider if you:  Have a hard time staying focused or finishing daily tasks.  Spend many hours a day feeling worried about everyday life.  Become exhausted by worry.  Start to have headaches, feel tense, or have nausea.  Urinate more than normal.  Have diarrhea. Get help right away if you have:  A racing  heart and shortness of breath.  Thoughts of hurting yourself or others. If you ever feel like you may hurt yourself or others, or have thoughts about taking your own life, get help right away. You can go to your nearest emergency department or call:  Your local emergency services (911 in the [place]  A suicide crisis helpline, such as the [facility] at 587-882-6567. This is open 24 hours a day. Summary  Taking steps to learn and use tension reduction techniques can  help calm you and help prevent triggering an anxiety reaction.  When used together, medicines, psychotherapy, and tension reduction techniques may be the most effective treatment.  Family, friends, and partners can play a big part in helping you recover from an anxiety disorder. This information is not intended to replace advice given to you by your health care provider. Make sure you discuss any questions you have with your health care provider. Document Revised: 10/10/2018 Document Reviewed: 10/10/2018 Elsevier Patient Education  [place].

## 2020-05-09 NOTE — Progress Notes (Signed)
Chief Complaint  Patient presents with  . Follow-up   F/u  1. 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 Labs reviewed 07/17/10 cmet, cbc, lipid a1c 5.7, tsh normal, B12 low 171, vit D 46.9 3. HTN elevated on norvasc 5 mg qd ramipril 20 mg qd and toprol 25 mg qd not taking meds qd as needed declines titration today for this reason    Review of Systems  Constitutional: Negative for weight loss.  HENT: Negative for hearing loss.   Eyes: Negative for blurred vision.  Respiratory: Negative for shortness of breath.   Cardiovascular: Negative for chest pain.  Gastrointestinal: Negative for abdominal pain.  Musculoskeletal: Negative for falls and joint pain.  Skin: Negative for rash.  Neurological: Negative for headaches.  Psychiatric/Behavioral: The patient is nervous/anxious.    Past Medical History:  Diagnosis Date  . Cardiomyopathy ([place])   . Chickenpox   . Fibroids   . Focal nodular hyperplasia of liver   . GERD (gastroesophageal reflux disease)   . Hypertension   . Prediabetes   . Strep throat   . UTI (lower urinary tract infection)    Past Surgical History:  Procedure Laterality Date  . CHOLECYSTECTOMY  2003   Family History  Problem Relation Age of Onset  . Heart disease Mother   . Prostate cancer Father   . Alcoholism Other        Uncle  . Arthritis Other        Grandparent  . Colon cancer Other        Great grandparent, great uncle  . Uterine cancer Maternal Aunt   . Breast cancer Cousin   . Stroke Other        Great grandparent  . Hypertension Other        Parent  . Bone cancer Other        Uncle  . Healthy Daughter    Social History   Socioeconomic History  . Marital status: Married    Spouse name: Not on file  . Number of children: Not on file   . Years of education: Not on file  . Highest education level: Not on file  Occupational History  . Not on file  Tobacco Use  . Smoking status: Never Smoker  . Smokeless tobacco: Never Used  Substance and Sexual Activity  . Alcohol use: Yes    Alcohol/week: 0.0 standard drinks    Comment: 1 a month, rare  . Drug use: No  . Sexual activity: Not on file  Other Topics Concern  . Not on file  Social History Narrative   Lives with husband and 2 children in a one story home.     Works at call center.  Education: associates degree.   Husband as of 05/02/19 on dialysis    Social Determinants of Health   Financial Resource Strain: Not on file  Food Insecurity: Not on file  Transportation Needs: Not on file  Physical Activity: Not on file  Stress: Not on file  Social Connections: Not on file  Intimate Partner Violence: Not on file   Current Meds  Medication Sig  . amLODipine (NORVASC) 5 MG tablet Take 1 tablet (5 mg total) by mouth daily.  Marland Kitchen  clindamycin (CLEOCIN T) 1 % lotion Apply topically 2 (two) times daily. Bid under arms  . cyanocobalamin (,VITAMIN B-12,) 1000 MCG/ML injection Inject 1 mL (1,000 mcg total) into the muscle every 30 (thirty) days.  . Esomeprazole Magnesium (NEXIUM PO) Take by mouth as needed.  . fexofenadine (ALLEGRA) 180 MG tablet Take 180 mg by mouth daily.  . fluticasone (FLONASE) 50 MCG/ACT nasal spray Place into both nostrils daily.  Marland Kitchen levonorgestrel (MIRENA) 20 MCG/24HR IUD 1 each by Intrauterine route once.  . metoprolol succinate (TOPROL-XL) 25 MG 24 hr tablet Take 1 tablet (25 mg total) by mouth daily.  . ramipril (ALTACE) 10 MG capsule Take 2 capsules (20 mg total) by mouth daily.  . valACYclovir (VALTREX) 500 MG tablet Take by mouth.   Allergies  Allergen Reactions  . Coreg [Carvedilol]     sob   No results found for this or any previous visit (from the past 2160 hour(s)). Objective  Body mass index is 41.22 kg/m. Wt Readings from Last 3  Encounters:  05/09/20 240 lb 1.9 oz (108.9 kg)  11/02/19 241 lb 6.4 oz (109.5 kg)  05/02/19 235 lb (106.6 kg)   Temp Readings from Last 3 Encounters:  05/09/20 98.4 F (36.9 C) (Oral)  11/02/19 (!) 97.4 F (36.3 C) (Temporal)  06/27/18 98.5 F (36.9 C) (Oral)   BP Readings from Last 3 Encounters:  05/09/20 (!) 138/94  11/02/19 140/88  05/02/19 130/90   Pulse Readings from Last 3 Encounters:  05/09/20 93  11/02/19 87  06/27/18 84    Physical Exam Vitals and nursing note reviewed.  Constitutional:      Appearance: Normal appearance. She is obese.  HENT:     Head: Normocephalic and atraumatic.  Eyes:     Conjunctiva/sclera: Conjunctivae normal.     Pupils: Pupils are equal, round, and reactive to light.  Cardiovascular:     Rate and Rhythm: Normal rate and regular rhythm.     Heart sounds: No murmur heard.   Pulmonary:     Effort: Pulmonary effort is normal.     Breath sounds: Normal breath sounds.  Skin:    General: Skin is warm and dry.  Neurological:     General: No focal deficit present.     Mental Status: She is alert and oriented to person, place, and time. Mental status is at baseline.  Psychiatric:        Mood and Affect: Mood normal.        Behavior: Behavior normal.        Thought Content: Thought content normal.        Judgment: Judgment normal.     Assessment  Plan  Hypertension, unspecified type Cont meds ramipril 20 mg, toprol 25 mg qd, and norvasc 5 mg qd  Encouraged to take meds F/u [facility] cards monitor BP   B12 deficiency rec Q30 d B12 shots  Anxiety - Plan: Ambulatory referral to Psychology [facility] group   Prediabetes Morbid obesity with BMI of 40.0-44.9, adult (HCC)  Healthy diet and exercise   HM Declines flu shot Tdap and twinrix discprev mmr immune covid 2/2 disc booster  OB/GYN appt sch for f/u ovarian cyst, fibroids now with BV as of 06/14/2018 [facility] -pap 06/02/17 neg pap neg HPV Dr. Leonides Schanz   mammo 2/7/2020neg,  07/30/19 negative Dr. Leonides Schanz   Colonoscopy disc age 3 est [facility] March 2022 disc colonoscopy   Reviewed labs 07/18/19   Vit D 06/16/17 15.2 on 50Know off rec  D3 4000 IU qd Neg HIV 06/14/2018, CMET, lipid, TSH, B12, vitamin D, GC/G from 06/14/2018 A1C 5.7 06/14/2018  CT chest 06/20/2017 1.9 cm liver lesion 0.2 cm right lower lobe pulm nodule consider repeat CT chest in 1 year CT chest 08/18/18 likely postinfectious benign nodules no change   MRI had ab/pelvis 08/03/19 repeat 07/2020    Of note B12 06/14/18 212, vitamin D 06/14/18 35.9 with h/o low vitmain D and B12 Provider: Dr. Olivia Mackie McLean-Scocuzza-Internal Medicine

## 2020-06-06 ENCOUNTER — Other Ambulatory Visit: Payer: Self-pay

## 2020-06-06 ENCOUNTER — Other Ambulatory Visit: Payer: BC Managed Care – PPO

## 2020-06-06 ENCOUNTER — Telehealth (INDEPENDENT_AMBULATORY_CARE_PROVIDER_SITE_OTHER): Payer: BC Managed Care – PPO | Admitting: Internal Medicine

## 2020-06-06 ENCOUNTER — Encounter: Payer: Self-pay | Admitting: Internal Medicine

## 2020-06-06 VITALS — Ht 64.0 in | Wt 240.0 lb

## 2020-06-06 DIAGNOSIS — U071 COVID-19: Secondary | ICD-10-CM

## 2020-06-06 DIAGNOSIS — Z20822 Contact with and (suspected) exposure to covid-19: Secondary | ICD-10-CM

## 2020-06-06 DIAGNOSIS — R519 Headache, unspecified: Secondary | ICD-10-CM | POA: Diagnosis not present

## 2020-06-06 NOTE — Progress Notes (Signed)
Virtual Visit via Video Note  I connected with Stacie Gill  on 06/06/20 at  1:00 PM EST by a video enabled telemedicine application and verified that I am speaking with the correct person using two identifiers.  Location patient: car Location provider:work or home office Persons participating in the virtual visit: patient, provider  I discussed the limitations of evaluation and management by telemedicine and the availability of in person appointments. The patient expressed understanding and agreed to proceed.   HPI: 06/01/20 husband sick and tested + covid 06/02/20 she had 1 day of h/a but has been having 1.5-2 months of h/a on and off and increased runny nose x 2 days ago which is resolved. Tylenol helps h/a denies cough, sob, fever, loss of taste of smell, sinus issues or sore throat 22 y.o daughter also getting tested for covid today    ROS: See pertinent positives and negatives per HPI.  Past Medical History:  Diagnosis Date  . Cardiomyopathy ([place])   . Chickenpox   . Fibroids   . Focal nodular hyperplasia of liver   . GERD (gastroesophageal reflux disease)   . Hypertension   . Prediabetes   . Strep throat   . UTI (lower urinary tract infection)     Past Surgical History:  Procedure Laterality Date  . CHOLECYSTECTOMY  2003     Current Outpatient Medications:  .  amLODipine (NORVASC) 5 MG tablet, Take 1 tablet (5 mg total) by mouth daily., Disp: , Rfl:  .  clindamycin (CLEOCIN T) 1 % lotion, Apply topically 2 (two) times daily. Bid under arms, Disp: 60 mL, Rfl: 11 .  fluticasone (FLONASE) 50 MCG/ACT nasal spray, Place into both nostrils daily., Disp: , Rfl:  .  levonorgestrel (MIRENA) 20 MCG/24HR IUD, 1 each by Intrauterine route once., Disp: , Rfl:  .  metoprolol succinate (TOPROL-XL) 25 MG 24 hr tablet, Take 1 tablet (25 mg total) by mouth daily., Disp: 90 tablet, Rfl: 3 .  ramipril (ALTACE) 10 MG capsule, Take 2 capsules (20 mg total) by mouth daily., Disp:  , Rfl:  .   valACYclovir (VALTREX) 500 MG tablet, Take by mouth., Disp: , Rfl:  .  cyanocobalamin (,VITAMIN B-12,) 1000 MCG/ML injection, Inject 1 mL (1,000 mcg total) into the muscle every 30 (thirty) days. (Patient not taking: Reported on 06/06/2020), Disp: 1 mL, Rfl: 0 .  Esomeprazole Magnesium (NEXIUM PO), Take by mouth as needed. (Patient not taking: Reported on 06/06/2020), Disp: , Rfl:  .  fexofenadine (ALLEGRA) 180 MG tablet, Take 180 mg by mouth daily. (Patient not taking: Reported on 06/06/2020), Disp: , Rfl:   EXAM:  VITALS per patient if applicable:  GENERAL: alert, oriented, appears well and in no acute distress  HEENT: atraumatic, conjunttiva clear, no obvious abnormalities on inspection of external nose and ears  NECK: normal movements of the head and neck  LUNGS: on inspection no signs of respiratory distress, breathing rate appears normal, no obvious gross SOB, gasping or wheezing  CV: no obvious cyanosis  MS: moves all visible extremities without noticeable abnormality  PSYCH/NEURO: pleasant and cooperative, no obvious depression or anxiety, speech and thought processing grossly intact  ASSESSMENT AND PLAN:  Discussed the following assessment and plan:  COVID-19 - Plan: Novel Coronavirus, NAA ([facility])  Exposure to COVID-19 virus - Plan: Novel Coronavirus, NAA ([facility])  Nonintractable episodic headache, unspecified headache type  Prn Tylenol  There is no medication other than over the counter meds: Mucinex dm green label for cough. Vitamin C  1000 mg daily. Vitamin D3 4000 Iu (units) daily. Zinc 100 mg daily. Quercetin 250-500 mg 2 times per day  Monitor pulse ox, buy from [facility] if oxygen is less than 90 please go to the hospital.     Are you feeling really sick? Shortness of breath, cough, chest pain? If so let me know  If worsening, go to hospital.  -we discussed possible serious and likely etiologies, options for evaluation and workup, limitations of telemedicine  visit vs in person visit, treatment, treatment risks and precautions.    I discussed the assessment and treatment plan with the patient. The patient was provided an opportunity to ask questions and all were answered. The patient agreed with the plan and demonstrated an understanding of the instructions.    Time spent 10 min Delorise Jackson, MD

## 2020-06-06 NOTE — Patient Instructions (Signed)
There is no medication other than over the counter meds: Mucinex dm green label for cough. Vitamin C 1000 mg daily. Vitamin D3 4000 Iu (units) daily. Zinc 100 mg daily. Quercetin 250-500 mg 2 times per day  Monitor pulse oximeter, buy from [facility] if oxygen is less than 90 please go to the hospital. Elderberry  Oil of oregano Hydration with water  Try to eat a meal even though you dont feel like it     Are you feeling really sick? Shortness of breath, cough, chest pain?confusion, dizzy,  If so let me know  If worsening, go to hospital. General Headache Without Cause A headache is pain or discomfort felt around the head or neck area. The specific cause of a headache may not be found. There are many causes and types of headaches. A few common ones are:  Tension headaches.  Migraine headaches.  Cluster headaches.  Chronic daily headaches. Follow these instructions at home: Watch your condition for any changes. Let your health care provider know about them. Take these steps to help with your condition: Managing pain  Take over-the-counter and prescription medicines only as told by your health care provider.  Lie down in a dark, quiet room when you have a headache.  If directed, put ice on your head and neck area: ? Put ice in a plastic bag. ? Place a towel between your skin and the bag. ? Leave the ice on for 20 minutes, 2-3 times per day.  If directed, apply heat to the affected area. Use the heat source that your health care provider recommends, such as a moist heat pack or a heating pad. ? Place a towel between your skin and the heat source. ? Leave the heat on for 20-30 minutes. ? Remove the heat if your skin turns bright red. This is especially important if you are unable to feel pain, heat, or cold. You may have a greater risk of getting burned.  Keep lights dim if bright lights bother you or make your headaches worse.      Eating and drinking  Eat meals on a regular  schedule.  If you drink alcohol: ? Limit how much you use to:  0-1 drink a day for women.  0-2 drinks a day for men. ? Be aware of how much alcohol is in your drink. In the [place] one drink equals one 12 oz bottle of beer (355 mL), one 5 oz glass of wine (148 mL), or one 1 oz glass of hard liquor (44 mL).  Stop drinking caffeine, or decrease the amount of caffeine you drink. General instructions  Keep a headache journal to help find out what may trigger your headaches. For example, write down: ? What you eat and drink. ? How much sleep you get. ? Any change to your diet or medicines.  Try massage or other relaxation techniques.  Limit stress.  Sit up straight, and do not tense your muscles.  Do not use any products that contain nicotine or tobacco, such as cigarettes, e-cigarettes, and chewing tobacco. If you need help quitting, ask your health care provider.  Exercise regularly as told by your health care provider.  Sleep on a regular schedule. Get 7-9 hours of sleep each night, or the amount recommended by your health care provider.  Keep all follow-up visits as told by your health care provider. This is important.   Contact a health care provider if:  Your symptoms are not helped by medicine.  You have  a headache that is different from the usual headache.  You have nausea or you vomit.  You have a fever. Get help right away if:  Your headache becomes severe quickly.  Your headache gets worse after moderate to intense physical activity.  You have repeated vomiting.  You have a stiff neck.  You have a loss of vision.  You have problems with speech.  You have pain in the eye or ear.  You have muscular weakness or loss of muscle control.  You lose your balance or have trouble walking.  You feel faint or pass out.  You have confusion.  You have a seizure. Summary  A headache is pain or discomfort felt around the head or neck area.  There are many  causes and types of headaches. In some cases, the cause may not be found.  Keep a headache journal to help find out what may trigger your headaches. Watch your condition for any changes. Let your health care provider know about them.  Contact a health care provider if you have a headache that is different from the usual headache, or if your symptoms are not helped by medicine.  Get help right away if your headache becomes severe, you vomit, you have a loss of vision, you lose your balance, or you have a seizure. This information is not intended to replace advice given to you by your health care provider. Make sure you discuss any questions you have with your health care provider. Document Revised: 11/28/2017 Document Reviewed: 11/28/2017 Elsevier Patient Education  [employer].

## 2020-06-06 NOTE — Progress Notes (Signed)
Patient states that her husband tested positive, sick on Sunday, positive on Monday.   Symptoms of runny nose and headache. Has not been tested. Since Patient husband was sick has not been tested.

## 2020-06-08 LAB — SARS-COV-2, NAA 2 DAY TAT

## 2020-06-08 LAB — NOVEL CORONAVIRUS, NAA: SARS-CoV-2, NAA: NOT DETECTED

## 2020-07-02 DIAGNOSIS — F331 Major depressive disorder, recurrent, moderate: Secondary | ICD-10-CM | POA: Diagnosis not present

## 2020-07-08 ENCOUNTER — Other Ambulatory Visit: Payer: Self-pay | Admitting: Obstetrics & Gynecology

## 2020-07-08 DIAGNOSIS — Z113 Encounter for screening for infections with a predominantly sexual mode of transmission: Secondary | ICD-10-CM | POA: Diagnosis not present

## 2020-07-08 DIAGNOSIS — E559 Vitamin D deficiency, unspecified: Secondary | ICD-10-CM | POA: Diagnosis not present

## 2020-07-08 DIAGNOSIS — Z124 Encounter for screening for malignant neoplasm of cervix: Secondary | ICD-10-CM | POA: Diagnosis not present

## 2020-07-08 DIAGNOSIS — Z1151 Encounter for screening for human papillomavirus (HPV): Secondary | ICD-10-CM | POA: Diagnosis not present

## 2020-07-08 DIAGNOSIS — Z1231 Encounter for screening mammogram for malignant neoplasm of breast: Secondary | ICD-10-CM

## 2020-07-08 LAB — HM PAP SMEAR: HM Pap smear: NORMAL

## 2020-07-16 DIAGNOSIS — F331 Major depressive disorder, recurrent, moderate: Secondary | ICD-10-CM | POA: Diagnosis not present

## 2020-07-30 ENCOUNTER — Ambulatory Visit
Admission: RE | Admit: 2020-07-30 | Discharge: 2020-07-30 | Disposition: A | Discharge status: Home / Self Care | Payer: BC Managed Care – PPO | Source: Ambulatory Visit | Attending: Obstetrics & Gynecology | Admitting: Obstetrics & Gynecology

## 2020-07-30 ENCOUNTER — Other Ambulatory Visit: Payer: Self-pay

## 2020-07-30 DIAGNOSIS — Z1231 Encounter for screening mammogram for malignant neoplasm of breast: Secondary | ICD-10-CM | POA: Insufficient documentation

## 2020-08-06 DIAGNOSIS — F331 Major depressive disorder, recurrent, moderate: Secondary | ICD-10-CM | POA: Diagnosis not present

## 2020-08-13 DIAGNOSIS — K7689 Other specified diseases of liver: Secondary | ICD-10-CM | POA: Diagnosis not present

## 2020-08-13 DIAGNOSIS — D134 Benign neoplasm of liver: Secondary | ICD-10-CM | POA: Diagnosis not present

## 2020-08-22 DIAGNOSIS — L821 Other seborrheic keratosis: Secondary | ICD-10-CM | POA: Diagnosis not present

## 2020-08-22 DIAGNOSIS — L853 Xerosis cutis: Secondary | ICD-10-CM | POA: Diagnosis not present

## 2020-08-22 DIAGNOSIS — D1801 Hemangioma of skin and subcutaneous tissue: Secondary | ICD-10-CM | POA: Diagnosis not present

## 2020-08-22 DIAGNOSIS — L732 Hidradenitis suppurativa: Secondary | ICD-10-CM | POA: Diagnosis not present

## 2020-08-26 ENCOUNTER — Other Ambulatory Visit: Payer: Self-pay

## 2020-08-26 ENCOUNTER — Encounter: Payer: Self-pay | Admitting: Internal Medicine

## 2020-08-26 ENCOUNTER — Ambulatory Visit (INDEPENDENT_AMBULATORY_CARE_PROVIDER_SITE_OTHER): Payer: BC Managed Care – PPO | Admitting: Internal Medicine

## 2020-08-26 VITALS — BP 116/80 | HR 97 | Temp 97.6°F | Ht 64.0 in | Wt 247.4 lb

## 2020-08-26 DIAGNOSIS — M549 Dorsalgia, unspecified: Secondary | ICD-10-CM | POA: Diagnosis not present

## 2020-08-26 DIAGNOSIS — M545 Low back pain, unspecified: Secondary | ICD-10-CM | POA: Diagnosis not present

## 2020-08-26 DIAGNOSIS — R296 Repeated falls: Secondary | ICD-10-CM

## 2020-08-26 DIAGNOSIS — M79671 Pain in right foot: Secondary | ICD-10-CM | POA: Insufficient documentation

## 2020-08-26 DIAGNOSIS — R7303 Prediabetes: Secondary | ICD-10-CM

## 2020-08-26 DIAGNOSIS — R29898 Other symptoms and signs involving the musculoskeletal system: Secondary | ICD-10-CM

## 2020-08-26 DIAGNOSIS — N281 Cyst of kidney, acquired: Secondary | ICD-10-CM

## 2020-08-26 DIAGNOSIS — M79672 Pain in left foot: Secondary | ICD-10-CM

## 2020-08-26 NOTE — Patient Instructions (Addendum)
Lidocaine or salonpas pain patches for back pain   12/03/2018 Refill [facility]  [place], [place] [address]  [phone]  [address]her[address], [address], [place] [address]  [phone] (Work)  [phone] (Fax)   Dr Cydney Ok insoles consider this     Plantar Fasciitis  Plantar fasciitis is a painful foot condition that affects the heel. It occurs when the band of tissue that connects the toes to the heel bone (plantar fascia) becomes irritated. This can happen as the result of exercising too much or doing other repetitive activities (overuse injury). Plantar fasciitis can cause mild irritation to severe pain that makes it difficult to walk or move. The pain is usually worse in the morning after sleeping, or after sitting or lying down for a period of time. Pain may also be worse after long periods of walking or standing. What are the causes? This condition may be caused by:  Standing for long periods of time.  Wearing shoes that do not have good arch support.  Doing activities that put stress on joints (high-impact activities). This includes ballet and exercise that makes your heart beat faster (aerobic exercise), such as running.  Being overweight.  An abnormal way of walking (gait).  Tight muscles in the back of your lower leg (calf).  High arches in your feet or flat feet.  Starting a new athletic activity. What are the signs or symptoms? The main symptom of this condition is heel pain. Pain may get worse after the following:  Taking the first steps after a time of rest, especially in the morning after awakening, or after you have been sitting or lying down for a while.  Long periods of standing still. Pain may decrease after 30-45 minutes of activity, such as gentle walking. How is this diagnosed? This condition may be diagnosed based on your medical history, a physical exam, and your symptoms. Your health  care provider will check for:  A tender area on the bottom of your foot.  A high arch in your foot or flat feet.  Pain when you move your foot.  Difficulty moving your foot. You may have imaging tests to confirm the diagnosis, such as:  X-rays.  Ultrasound.  MRI. How is this treated? Treatment for plantar fasciitis depends on how severe your condition is. Treatment may include:  Rest, ice, pressure (compression), and raising (elevating) the affected foot. This is called RICE therapy. Your health care provider may recommend RICE therapy along with over-the-counter pain medicines to manage your pain.  Exercises to stretch your calves and your plantar fascia.  A splint that holds your foot in a stretched, upward position while you sleep (night splint).  Physical therapy to relieve symptoms and prevent problems in the future.  Injections of steroid medicine (cortisone) to relieve pain and inflammation.  Stimulating your plantar fascia with electrical impulses (extracorporeal shock wave therapy). This is usually the last treatment option before surgery.  Surgery, if other treatments have not worked after 12 months. Follow these instructions at home: Managing pain, stiffness, and swelling  If directed, put ice on the painful area. To do this: ? Put ice in a plastic bag, or use a frozen bottle of water. ? Place a towel between your skin and the bag or bottle. ? Roll the bottom of your foot over the bag or bottle. ? Do this for 20 minutes, 2-3 times a day.  Wear athletic shoes that have air-sole  or gel-sole cushions, or try soft shoe inserts that are designed for plantar fasciitis.  Elevate your foot above the level of your heart while you are sitting or lying down.   Activity  Avoid activities that cause pain. Ask your health care provider what activities are safe for you.  Do physical therapy exercises and stretches as told by your health care provider.  Try activities and  forms of exercise that are easier on your joints (low impact). Examples include swimming, water aerobics, and biking. General instructions  Take over-the-counter and prescription medicines only as told by your health care provider.  Wear a night splint while sleeping, if told by your health care provider. Loosen the splint if your toes tingle, become numb, or turn cold and blue.  Maintain a healthy weight, or work with your health care provider to lose weight as needed.  Keep all follow-up visits. This is important. Contact a health care provider if you have:  Symptoms that do not go away with home treatment.  Pain that gets worse.  Pain that affects your ability to move or do daily activities. Summary  Plantar fasciitis is a painful foot condition that affects the heel. It occurs when the band of tissue that connects the toes to the heel bone (plantar fascia) becomes irritated.  Heel pain is the main symptom of this condition. It may get worse after exercising too much or standing still for a long time.  Treatment varies, but it usually starts with rest, ice, pressure (compression), and raising (elevating) the affected foot. This is called RICE therapy. Over-the-counter medicines can also be used to manage pain. This information is not intended to replace advice given to you by your health care provider. Make sure you discuss any questions you have with your health care provider. Document Revised: 08/27/2019 Document Reviewed: 08/27/2019 Elsevier Patient Education  2021 [employer].  Thoracic Strain Rehab Ask your health care provider which exercises are safe for you. Do exercises exactly as told by your health care provider and adjust them as directed. It is normal to feel mild stretching, pulling, tightness, or discomfort as you do these exercises. Stop right away if you feel sudden pain or your pain gets worse. Do not begin these exercises until told by your health care  provider. Stretching and range-of-motion exercise This exercise warms up your muscles and joints and improves the movement and flexibility of your back and shoulders. This exercise also helps to relieve pain. Chest and spine stretch 1. Lie down on your back on a firm surface. 2. Roll a towel or a small blanket so it is about 4 inches (10 cm) in diameter. 3. Put the towel lengthwise under the middle of your back so it is under your spine, but not under your shoulder blades. 4. Put your hands behind your head and let your elbows fall to your sides. This will increase your stretch. 5. Take a deep breath (inhale). 6. Hold for __________ seconds. 7. Relax after you breathe out (exhale). Repeat __________ times. Complete this exercise __________ times a day.   Strengthening exercises These exercises build strength and endurance in your back and your shoulder blade muscles. Endurance is the ability to use your muscles for a long time, even after they get tired. Alternating arm and leg raises 1. Get on your hands and knees on a firm surface. If you are on a hard floor, you may want to use padding, such as an exercise mat, to cushion your  knees. 2. Line up your arms and legs. Your hands should be directly below your shoulders, and your knees should be directly below your hips. 3. Lift your left leg behind you. At the same time, raise your right arm and straighten it in front of you. ? Do not lift your leg higher than your hip. ? Do not lift your arm higher than your shoulder. ? Keep your abdominal and back muscles tight. ? Keep your hips facing the ground. ? Do not arch your back. ? Keep your balance carefully, and do not hold your breath. 4. Hold for __________ seconds. 5. Slowly return to the starting position and repeat with your right leg and your left arm. Repeat __________ times. Complete this exercise __________ times a day.   Straight arm rows This exercise is also called shoulder  extension exercise. 1. Stand with your feet shoulder width apart. 2. Secure an exercise band to a stable object in front of you so the band is at or above shoulder height. 3. Hold one end of the exercise band in each hand. 4. Straighten your elbows and lift your hands up to shoulder height. 5. Step back, away from the secured end of the exercise band, until the band stretches. 6. Squeeze your shoulder blades together and pull your hands down to the sides of your thighs. Stop when your hands are straight down by your sides. This is shoulder extension. Do not let your hands go behind your body. 7. Hold for __________ seconds. 8. Slowly return to the starting position. Repeat __________ times. Complete this exercise __________ times a day.   Prone shoulder external rotation 1. Lie on your abdomen on a firm bed so your left / right forearm hangs over the edge of the bed and your upper arm is on the bed, straight out from your body. This is the prone position. ? Your elbow should be bent. ? Your palm should be facing your feet. 2. If instructed, hold a __________ weight in your hand. 3. Squeeze your shoulder blade toward the middle of your back. Do not let your shoulder lift toward your ear. 4. Keep your elbow bent in a 90-degree angle (right angle) while you slowly move your forearm up toward the ceiling. Move your forearm up to the height of the bed, toward your head. This is external rotation. ? Your upper arm should not move. ? At the top of the movement, your palm should face the floor. 5. Hold for __________ seconds. 6. Slowly return to the starting position and relax your muscles. Repeat __________ times. Complete this exercise __________ times a day. Rowing scapular retraction This is an exercise in which the shoulder blades (scapulae) are pulled toward each other (retraction). 1. Sit in a stable chair without armrests, or stand up. 2. Secure an exercise band to a stable object in front  of you so the band is at shoulder height. 3. Hold one end of the exercise band in each hand. Your palms should face down. 4. Bring your arms out straight in front of you. 5. Step back, away from the secured end of the exercise band, until the band stretches. 6. Pull the band backward. As you do this, bend your elbows and squeeze your shoulder blades together, but avoid letting the rest of your body move. Do not shrug your shoulders upward while you do this. 7. Stop when your elbows are at your sides or slightly behind your body. 8. Hold for __________ seconds. 9.  Slowly straighten your arms to return to the starting position. Repeat __________ times. Complete this exercise __________ times a day.   Posture and body mechanics Good posture and healthy body mechanics can help to relieve stress in your body's tissues and joints. Body mechanics refers to the movements and positions of your body while you do your daily activities. Posture is part of body mechanics. Good posture means:  Your spine is in its natural S-curve position (neutral).  Your shoulders are pulled back slightly.  Your head is not tipped forward. Follow these guidelines to improve your posture and body mechanics in your everyday activities. Standing  When standing, keep your spine neutral and your feet about hip width apart. Keep a slight bend in your knees. Your ears, shoulders, and hips should line up with each other.  When you do a task in which you lean forward while standing in one place for a long time, place one foot up on a stable object that is 2-4 inches (5-10 cm) high, such as a footstool. This helps keep your spine neutral.   Sitting  When sitting, keep your spine neutral and keep your feet flat on the floor. Use a footrest, if necessary, and keep your thighs parallel to the floor. Avoid rounding your shoulders, and avoid tilting your head forward.  When working at a desk or a computer, keep your desk at a height  where your hands are slightly lower than your elbows. Slide your chair under your desk so you are close enough to maintain good posture.  When working at a computer, place your monitor at a height where you are looking straight ahead and you do not have to tilt your head forward or downward to look at the screen.   Resting When lying down and resting, avoid positions that are most painful for you.  If you have pain with activities such as sitting, bending, stooping, or squatting (flexion-basedactivities), lie in a position in which your body does not bend very much. For example, avoid curling up on your side with your arms and knees near your chest (fetal position).  If you have pain with activities such as standing for a long time or reaching with your arms (extension-basedactivities), lie with your spine in a neutral position and bend your knees slightly. Try the following positions: ? Lie on your side with a pillow between your knees. ? Lie on your back with a pillow under your knees.   Lifting  When lifting objects, keep your feet at least shoulder width apart and tighten your abdominal muscles.  Bend your knees and hips and keep your spine neutral. It is important to lift using the strength of your legs, not your back. Do not lock your knees straight out.  Always ask for help to lift heavy or awkward objects.   This information is not intended to replace advice given to you by your health care provider. Make sure you discuss any questions you have with your health care provider. Document Revised: 09/01/2018 Document Reviewed: 06/19/2018 Elsevier Patient Education  2021 [employer].

## 2020-08-26 NOTE — Progress Notes (Signed)
Chief Complaint  Patient presents with  . Back Pain   F/u  1. Mid and low back pain x 1.5- 2 months after lifting her grandmother up and feels like pulled muscle in low back and mid back pain in mid back today 2/10 07/2017 +OA mid back tried PT in the past and helped but now pain is back Dr. Leonides Schanz 07/2020 gave her flexeril 5 mg qhs prn and valium 5 and advised tens she did not take tens or valium. She has fallen 3x due to legs giving out since 08/17/20 2x from a standing position and 1x from sitting to standing on 08/23/20  Turning neck to left or looking down causes mid back pain  She has pending referral for plastic surgery for breast reduction consult due to chronic low back pain   2. C/o heel pain est with Dr. Vickki Muff she started wearing more sneakers and less crocks and pain is better  3. Prediabetes wants to repeat labs 11/07/20 visit fasting had labs 07/08/20 Dr. Leonides Schanz  A1C in the past  Has been 5.7     Review of Systems  Constitutional: Negative for weight loss.  HENT: Negative for hearing loss.   Eyes: Negative for blurred vision.  Respiratory: Negative for shortness of breath.   Cardiovascular: Negative for chest pain.  Gastrointestinal: Negative for abdominal pain.  Musculoskeletal: Positive for back pain and falls.  Skin: Negative for rash.  Neurological: Positive for headaches.  Psychiatric/Behavioral: Negative for depression.   Past Medical History:  Diagnosis Date  . Cardiomyopathy ([place])   . Chickenpox   . Fibroids   . Focal nodular hyperplasia of liver   . GERD (gastroesophageal reflux disease)   . Hypertension   . Prediabetes   . Strep throat   . UTI (lower urinary tract infection)    Past Surgical History:  Procedure Laterality Date  . CHOLECYSTECTOMY  2003   Family History  Problem Relation Age of Onset  . Heart disease Mother   . Prostate cancer Father   . Alcoholism Other        Uncle  . Arthritis Other        Grandparent  . Colon cancer Other         Great grandparent, great uncle  . Uterine cancer Maternal Aunt   . Breast cancer Cousin   . Stroke Other        Great grandparent  . Hypertension Other        Parent  . Bone cancer Other        Uncle  . Healthy Daughter    Social History   Socioeconomic History  . Marital status: Married    Spouse name: Not on file  . Number of children: Not on file  . Years of education: Not on file  . Highest education level: Not on file  Occupational History  . Not on file  Tobacco Use  . Smoking status: Never Smoker  . Smokeless tobacco: Never Used  Substance and Sexual Activity  . Alcohol use: Yes    Alcohol/week: 0.0 standard drinks    Comment: 1 a month, rare  . Drug use: No  . Sexual activity: Not on file  Other Topics Concern  . Not on file  Social History Narrative   Lives with husband and 2 children in a one story home.     Works at call center.  Education: associates degree.   Husband as of 05/02/19 on dialysis    Social Determinants  of Health   Financial Resource Strain: Not on file  Food Insecurity: Not on file  Transportation Needs: Not on file  Physical Activity: Not on file  Stress: Not on file  Social Connections: Not on file  Intimate Partner Violence: Not on file   Current Meds  Medication Sig  . amLODipine (NORVASC) 5 MG tablet Take 1 tablet (5 mg total) by mouth daily.  . clindamycin (CLEOCIN T) 1 % lotion Apply topically 2 (two) times daily. Bid under arms  . cyanocobalamin (,VITAMIN B-12,) 1000 MCG/ML injection Inject 1 mL (1,000 mcg total) into the muscle every 30 (thirty) days.  . Esomeprazole Magnesium (NEXIUM PO) Take by mouth as needed.  . fexofenadine (ALLEGRA) 180 MG tablet Take 180 mg by mouth daily.  . fluticasone (FLONASE) 50 MCG/ACT nasal spray Place into both nostrils daily.  Marland Kitchen levonorgestrel (MIRENA) 20 MCG/24HR IUD 1 each by Intrauterine route once.  . metoprolol succinate (TOPROL-XL) 25 MG 24 hr tablet Take 1 tablet (25 mg total) by mouth  daily.  . ramipril (ALTACE) 10 MG capsule Take 2 capsules (20 mg total) by mouth daily.  . valACYclovir (VALTREX) 500 MG tablet Take by mouth.   Allergies  Allergen Reactions  . Coreg [Carvedilol]     sob   Recent Results (from the past 2160 hour(s))  Novel Coronavirus, NAA ([facility])     Status: None   Collection Time: 06/06/20  1:47 PM   Specimen: Nasal Swab; Nasopharyngeal(NP) swabs in vial transport medium   Nasopharynge  Result Value Ref Range   SARS-CoV-2, NAA Not Detected Not Detected    Comment: This nucleic acid amplification test was developed and its performance characteristics determined by [employer]. Nucleic acid amplification tests include RT-PCR and TMA. This test has not been FDA cleared or approved. This test has been authorized by FDA under an Emergency Use Authorization (EUA). This test is only authorized for the duration of time the declaration that circumstances exist justifying the authorization of the emergency use of in vitro diagnostic tests for detection of SARS-CoV-2 virus and/or diagnosis of COVID-19 infection under section 564(b)(1) of the Act, 21 U.S.C. 403KVQ-2(V) (1), unless the authorization is terminated or revoked sooner. When diagnostic testing is negative, the possibility of a false negative result should be considered in the context of a patient's recent exposures and the presence of clinical signs and symptoms consistent with COVID-19. An individual without symptoms of COVID-19 and who is not shedding SARS-CoV-2 virus wo uld expect to have a negative (not detected) result in this assay.   SARS-COV-2, NAA 2 DAY TAT     Status: None   Collection Time: 06/06/20  1:47 PM   Nasopharynge  Result Value Ref Range   SARS-CoV-2, NAA 2 DAY TAT Performed    Objective  Body mass index is 42.47 kg/m. Wt Readings from Last 3 Encounters:  08/26/20 247 lb 7 oz (112.2 kg)  06/06/20 240 lb (108.9 kg)  05/09/20 240 lb 1.9 oz (108.9 kg)    Temp Readings from Last 3 Encounters:  08/26/20 97.6 F (36.4 C)  05/09/20 98.4 F (36.9 C) (Oral)  11/02/19 (!) 97.4 F (36.3 C) (Temporal)   BP Readings from Last 3 Encounters:  08/26/20 116/80  05/09/20 (!) 138/94  11/02/19 140/88   Pulse Readings from Last 3 Encounters:  08/26/20 97  05/09/20 93  11/02/19 87    Physical Exam Vitals and nursing note reviewed.  Constitutional:      Appearance: Normal appearance. She is  well-developed and well-groomed. She is obese.  HENT:     Head: Normocephalic and atraumatic.  Cardiovascular:     Rate and Rhythm: Normal rate and regular rhythm.     Heart sounds: Normal heart sounds. No murmur heard.   Pulmonary:     Effort: Pulmonary effort is normal.     Breath sounds: Normal breath sounds.  Musculoskeletal:     Thoracic back: Tenderness present.     Lumbar back: Normal. Negative right straight leg raise test and negative left straight leg raise test.       Back:     Comments: With ROM neck flexion and looking down has mid back pain    Skin:    General: Skin is warm and dry.  Neurological:     General: No focal deficit present.     Mental Status: She is alert and oriented to person, place, and time. Mental status is at baseline.     Gait: Gait normal.  Psychiatric:        Attention and Perception: Attention and perception normal.        Mood and Affect: Mood and affect normal.        Speech: Speech normal.        Behavior: Behavior normal. Behavior is cooperative.        Thought Content: Thought content normal.        Cognition and Memory: Cognition and memory normal.        Judgment: Judgment normal.     Assessment  Plan  Lumbar back pain - Plan: MR Thoracic Spine Wo Contrast, MR Lumbar Spine Wo Contrast sch at [facility]  Mid back pain - Plan: MR Thoracic Spine Wo Contrast, MR Lumbar Spine Wo Contrast  Frequent falls - Plan: MR Thoracic Spine Wo Contrast, MR Lumbar Spine Wo Contrast  Weakness of both lower  extremities - Plan: MR Thoracic Spine Wo Contrast, MR Lumbar Spine Wo Contrast  Consider PT at [facility] in the future   Cyst of left kidney  Prediabetes Check labs at f/u   Pain of both heels  Consider Dr Vickki Muff call to sch  Given exercises to do   HM Declines flu shot Tdap and twinrix discprev mmr immune covid 2/2disc booster  OB/GYN appt sch for f/u ovarian cyst, fibroids now with BV as of 06/14/2018 [facility] -pap 06/02/17 neg pap neg HPV Dr. Leonides Schanz labs had 07/08/20 pap neg pap neg HPV  mammo 07/30/20 negativeDr. Ward   Colonoscopy disc age 17[facility] March 2022 disc colonoscopy and will disc at f/u 11/07/20  Reviewed labs 07/08/20  Vit D 06/16/17 15.2 on 50Know off rec D3 4000 IU qd Neg HIV 06/14/2018, CMET, lipid, TSH, B12, vitamin D, GC/G from 06/14/2018 A1C 5.7 06/14/2018  CT chest 06/20/2017 1.9 cm liver lesion 0.2 cm right lower lobe pulm nodule consider repeat CT chest in 1 year CT chest 08/18/18 likely postinfectious benign nodules no change   MRI had ab/pelvis 3/12/21repeat 07/2020    Of note B12 06/14/18 212, vitamin D 06/14/18 35.9 with h/o low vitmain D and B12 Provider: Dr. Olivia Mackie McLean-Scocuzza-Internal Medicine

## 2020-08-28 ENCOUNTER — Telehealth: Payer: Self-pay | Admitting: Internal Medicine

## 2020-08-28 NOTE — Telephone Encounter (Signed)
lft vm for pt to call ofc to sch MRI. Thanks

## 2020-08-28 NOTE — Telephone Encounter (Signed)
Ok, Thanks

## 2020-08-28 NOTE — Telephone Encounter (Signed)
Mri for [facility]

## 2020-08-28 NOTE — Telephone Encounter (Signed)
Ok. Thanks!

## 2020-08-28 NOTE — Telephone Encounter (Signed)
Pt called returning your call 

## 2020-10-01 ENCOUNTER — Telehealth: Payer: Self-pay | Admitting: Internal Medicine

## 2020-10-01 NOTE — Telephone Encounter (Signed)
Can you all authorize if I ordered thanks?

## 2020-10-01 NOTE — Telephone Encounter (Signed)
Please advise 

## 2020-10-01 NOTE — Telephone Encounter (Signed)
Stacie Gill from [place], [phone]. Patient's procedure has been changed from 08/28/2020 and reschedule to 10/13/20 at [place] numbers; [employer] coverage, [id number], Lumbar spine was [id number]. [name] states this has to be authorized.

## 2020-10-13 DIAGNOSIS — M545 Low back pain, unspecified: Secondary | ICD-10-CM | POA: Diagnosis not present

## 2020-10-13 DIAGNOSIS — R29898 Other symptoms and signs involving the musculoskeletal system: Secondary | ICD-10-CM | POA: Diagnosis not present

## 2020-10-13 DIAGNOSIS — M4807 Spinal stenosis, lumbosacral region: Secondary | ICD-10-CM | POA: Diagnosis not present

## 2020-10-13 DIAGNOSIS — R296 Repeated falls: Secondary | ICD-10-CM | POA: Diagnosis not present

## 2020-10-13 DIAGNOSIS — M549 Dorsalgia, unspecified: Secondary | ICD-10-CM | POA: Diagnosis not present

## 2020-10-13 DIAGNOSIS — M546 Pain in thoracic spine: Secondary | ICD-10-CM | POA: Diagnosis not present

## 2020-10-14 ENCOUNTER — Telehealth: Payer: Self-pay

## 2020-10-14 ENCOUNTER — Telehealth: Payer: Self-pay | Admitting: Internal Medicine

## 2020-10-14 ENCOUNTER — Encounter: Payer: Self-pay | Admitting: Internal Medicine

## 2020-10-14 DIAGNOSIS — Z5181 Encounter for therapeutic drug level monitoring: Secondary | ICD-10-CM | POA: Diagnosis not present

## 2020-10-14 DIAGNOSIS — I10 Essential (primary) hypertension: Secondary | ICD-10-CM | POA: Diagnosis not present

## 2020-10-14 DIAGNOSIS — O903 Peripartum cardiomyopathy: Secondary | ICD-10-CM | POA: Diagnosis not present

## 2020-10-14 DIAGNOSIS — R937 Abnormal findings on diagnostic imaging of other parts of musculoskeletal system: Secondary | ICD-10-CM | POA: Insufficient documentation

## 2020-10-14 NOTE — Telephone Encounter (Signed)
Mailed MRI report to pt per Dr. Audrie Gallus request.

## 2020-10-14 NOTE — Telephone Encounter (Signed)
MRI 10/13/20 lumbar  mild facet arthropathy L2-L3, L3-L4, L4-5, L5-S1 small disc bulge and mild facet arthropathy with moderate left and mild right narrowing  Does she want to see a specialist for injections I.e steroid injections?   Do PT?

## 2020-10-17 NOTE — Telephone Encounter (Signed)
No answer, no voicemail.

## 2020-11-07 ENCOUNTER — Ambulatory Visit (INDEPENDENT_AMBULATORY_CARE_PROVIDER_SITE_OTHER): Payer: BC Managed Care – PPO | Admitting: Internal Medicine

## 2020-11-07 ENCOUNTER — Other Ambulatory Visit: Payer: Self-pay

## 2020-11-07 ENCOUNTER — Encounter: Payer: Self-pay | Admitting: Internal Medicine

## 2020-11-07 VITALS — BP 124/80 | HR 87 | Temp 98.4°F | Ht 64.0 in | Wt 242.6 lb

## 2020-11-07 DIAGNOSIS — E538 Deficiency of other specified B group vitamins: Secondary | ICD-10-CM

## 2020-11-07 DIAGNOSIS — R937 Abnormal findings on diagnostic imaging of other parts of musculoskeletal system: Secondary | ICD-10-CM | POA: Diagnosis not present

## 2020-11-07 DIAGNOSIS — M47816 Spondylosis without myelopathy or radiculopathy, lumbar region: Secondary | ICD-10-CM | POA: Diagnosis not present

## 2020-11-07 DIAGNOSIS — I10 Essential (primary) hypertension: Secondary | ICD-10-CM | POA: Diagnosis not present

## 2020-11-07 DIAGNOSIS — N938 Other specified abnormal uterine and vaginal bleeding: Secondary | ICD-10-CM

## 2020-11-07 HISTORY — DX: Spondylosis without myelopathy or radiculopathy, lumbar region: M47.816

## 2020-11-07 NOTE — Progress Notes (Addendum)
Chief Complaint  Patient presents with   Follow-up   F/u  1. Abnormal MRI T and Lumbar spine for now no pain has done PT in the past mid back pain pt thinks due to large breast and where strap sits and wants breast reduction pending referral MRI T spine 10/13/20  T12-L1: Unremarkable.    L1-L2: Unremarkable.  L2-L3: Mild facet arthropathy without significant canal neuroforaminal  stenosis.    L3-L4: Mild facet arthropathy without significant canal or neuroforaminal  stenosis.    L4-L5: Mild facet arthropathy without significant canal neuroforaminal  stenosis.    L5-S1: Small disc bulge and mild facet arthropathy with moderate left and  mild right neuroforaminal stenosis.      IMPRESSION:  1. Small bulge at L5-S1 with moderate left-sided neuroforaminal stenosis.       10/13/20 mri lumbar T12-L1: Unremarkable.    L1-L2: Unremarkable.  L2-L3: Mild facet arthropathy without significant canal neuroforaminal  stenosis.    L3-L4: Mild facet arthropathy without significant canal or neuroforaminal  stenosis.    L4-L5: Mild facet arthropathy without significant canal neuroforaminal  stenosis.    L5-S1: Small disc bulge and mild facet arthropathy with moderate left and  mild right neuroforaminal stenosis.      IMPRESSION:  1. Small bulge at L5-S1 with moderate left-sided neuroforaminal stenosis.     2. C/o DUB normally no cycle with IUD but trying to eat right and exercise and this changed cycles disc rec ob/gyn disc and sch f/u if needed used to see Dr. Leonides Schanz but disc Dr. Leafy Ro  3. Htn off norvasc 5 mg due to leg swelling on toprol xl 25 mg qd and altace 10 mg qd and now hctz 25 mg daily and tolerating BP controlled  Per [facility] cardiology Dr. Edwin Dada  4. B12 shots wants to get them here  Review of Systems  Constitutional:  Negative for weight loss.  HENT:  Negative for hearing loss.   Eyes:  Negative for blurred vision.  Respiratory:  Negative for shortness of breath.    Cardiovascular:  Negative for chest pain.  Gastrointestinal:  Negative for abdominal pain.  Musculoskeletal:  Positive for back pain.  Skin:  Negative for rash.  Neurological:  Negative for dizziness and headaches.  Past Medical History:  Diagnosis Date   Cardiomyopathy ([place])    Chickenpox    Fibroids    Focal nodular hyperplasia of liver    GERD (gastroesophageal reflux disease)    Hypertension    Prediabetes    Strep throat    UTI (lower urinary tract infection)    Past Surgical History:  Procedure Laterality Date   CHOLECYSTECTOMY  2003   Family History  Problem Relation Age of Onset   Heart disease Mother    Prostate cancer Father    Alcoholism Other        Uncle   Arthritis Other        Grandparent   Colon cancer Other        Great grandparent, great uncle   Uterine cancer Maternal Aunt    Breast cancer Cousin    Stroke Other        Great grandparent   Hypertension Other        Parent   Bone cancer Other        Uncle   Healthy Daughter    Social History   Socioeconomic History   Marital status: Married    Spouse name: Not on file   Number of children:  Not on file   Years of education: Not on file   Highest education level: Not on file  Occupational History   Not on file  Tobacco Use   Smoking status: Never   Smokeless tobacco: Never  Substance and Sexual Activity   Alcohol use: Yes    Alcohol/week: 0.0 standard drinks    Comment: 1 a month, rare   Drug use: No   Sexual activity: Not on file  Other Topics Concern   Not on file  Social History Narrative   Lives with husband and 2 children in a one story home.     Works at call center.  Education: associates degree.   Husband as of 05/02/19 on dialysis    Social Determinants of Health   Financial Resource Strain: Not on file  Food Insecurity: Not on file  Transportation Needs: Not on file  Physical Activity: Not on file  Stress: Not on file  Social Connections: Not on file  Intimate  Partner Violence: Not on file   Current Meds  Medication Sig   clindamycin (CLEOCIN T) 1 % lotion Apply topically 2 (two) times daily. Bid under arms   cyanocobalamin (,VITAMIN B-12,) 1000 MCG/ML injection Inject 1 mL (1,000 mcg total) into the muscle every 30 (thirty) days.   Esomeprazole Magnesium (NEXIUM PO) Take by mouth as needed.   fluticasone (FLONASE) 50 MCG/ACT nasal spray Place into both nostrils daily.   hydrochlorothiazide (HYDRODIURIL) 25 MG tablet Take 25 mg by mouth daily.   levonorgestrel (MIRENA) 20 MCG/24HR IUD 1 each by Intrauterine route once.   metoprolol succinate (TOPROL-XL) 25 MG 24 hr tablet Take 1 tablet (25 mg total) by mouth daily.   ramipril (ALTACE) 10 MG capsule Take 2 capsules (20 mg total) by mouth daily.   valACYclovir (VALTREX) 500 MG tablet Take by mouth.   Allergies  Allergen Reactions   Coreg [Carvedilol]     sob   Norvasc [Amlodipine]     5 mg leg swelling    No results found for this or any previous visit (from the past 2160 hour(s)). Objective  Body mass index is 41.64 kg/m. Wt Readings from Last 3 Encounters:  11/07/20 242 lb 9.6 oz (110 kg)  08/26/20 247 lb 7 oz (112.2 kg)  06/06/20 240 lb (108.9 kg)   Temp Readings from Last 3 Encounters:  11/07/20 98.4 F (36.9 C) (Oral)  08/26/20 97.6 F (36.4 C)  05/09/20 98.4 F (36.9 C) (Oral)   BP Readings from Last 3 Encounters:  11/07/20 124/80  08/26/20 116/80  05/09/20 (!) 138/94   Pulse Readings from Last 3 Encounters:  11/07/20 87  08/26/20 97  05/09/20 93    Physical Exam Vitals and nursing note reviewed.  Constitutional:      Appearance: Normal appearance. She is well-developed and well-groomed. She is morbidly obese.  HENT:     Head: Normocephalic and atraumatic.  Eyes:     Conjunctiva/sclera: Conjunctivae normal.     Pupils: Pupils are equal, round, and reactive to light.  Cardiovascular:     Rate and Rhythm: Normal rate and regular rhythm.     Heart sounds:  Normal heart sounds. No murmur heard. Pulmonary:     Effort: Pulmonary effort is normal.     Breath sounds: Normal breath sounds.  Abdominal:     General: Abdomen is flat. Bowel sounds are normal.  Skin:    General: Skin is warm and moist.  Neurological:     General: No focal deficit  present.     Mental Status: She is alert and oriented to person, place, and time. Mental status is at baseline.     Gait: Gait normal.  Psychiatric:        Attention and Perception: Attention and perception normal.        Mood and Affect: Mood and affect normal.        Speech: Speech normal.        Behavior: Behavior normal. Behavior is cooperative.        Thought Content: Thought content normal.        Cognition and Memory: Cognition and memory normal.        Judgment: Judgment normal.    Assessment  Plan  Abnormal MRI, thoracic spine Lumbar facet arthropathy Abnormal MRI, lumbar spine Consider PT again referral  Pt wants breast reduction pending consults Disc pm&R injections in future if needed and consider referral  Otc pain patches  Hypertension, controlled con meds hctz 25 mg qd, altace 10 mg qd and toprol 25 mg xl qd  F/u cards  B12 deficiency Will get B12 shots here   DUB (dysfunctional uterine bleeding)  F/u with ob/gyn has iud   HM Declines flu shot  Tdap will come back and get this  Consider twinrix disc prev  mmr immune  covid 2/2 disc booster    OB/GYN appt sch for f/u ovarian cyst, fibroids now with BV as of 06/14/2018 [facility] OB[facility]GYN -pap 06/02/17 neg pap neg HPV  Dr. Leonides Schanz labs had 07/08/20 pap neg pap neg HPV   mammo 07/30/20 negative  Dr. Leonides Schanz    Colonoscopy disc age 45 est [facility] Call back when ready for referral and let know where    Reviewed labs 07/08/20   Vit D 06/16/17 15.2 on 50K now off rec D3 4000 IU qd  Neg HIV 06/14/2018,  CMET, lipid, TSH, B12, vitamin D, GC/G from 06/14/2018 A1C 5.7 06/14/2018   CT chest 06/20/2017 1.9 cm liver lesion 0.2 cm right lower lobe  pulm nodule consider repeat CT chest in 1 year  CT chest 08/18/18 likely postinfectious benign nodules no change   MRI had ab/pelvis 08/03/19 repeat 07/2020      Of note B12 06/14/18 212, vitamin D 06/14/18 35.9 with h/o low vitmain D and B12   Provider: Dr. Olivia Mackie McLean-Scocuzza-Internal Medicine

## 2020-11-07 NOTE — Patient Instructions (Addendum)
[facility]   [place] [place], [place] [address]   [phone]   Dr. Leafy Ro  [place]   [facility]   [place], [place] [address]   [phone] (Work)   [phone] (Fax)    Let me know about colonoscopy referral need cardiology clearance  01/2021  [facility], [facility] or [facility]   Consider covid vaccine boosters   Get labs done for Dr. Edwin Dada  Call back for Tdap vaccine this is every 10 years   Tdap (Tetanus, Diphtheria, Pertussis) Vaccine: What You Need to Know 1. Why get vaccinated? Tdap vaccine can prevent tetanus, diphtheria, and pertussis. Diphtheria and pertussis spread from person to person. Tetanus enters the body through cuts or wounds. TETANUS (T) causes painful stiffening of the muscles. Tetanus can lead to serious health problems, including being unable to open the mouth, having trouble swallowing and breathing, or death. DIPHTHERIA (D) can lead to difficulty breathing, heart failure, paralysis, or death. PERTUSSIS (aP), also known as "whooping cough," can cause uncontrollable, violent coughing that makes it hard to breathe, eat, or drink. Pertussis can be extremely serious especially in babies and young children, causing pneumonia, convulsions, brain damage, or death. In teens and adults, it can cause weight loss, loss of bladder control, passing out, and rib fractures from severe coughing. 2. Tdap vaccine Tdap is only for children 7 years and older, adolescents, and adults.  Adolescents should receive a single dose of Tdap, preferably at age 64 or 42 years. Pregnant people should get a dose of Tdap during every pregnancy, preferably during the early part of the third trimester, to help protect the newborn from pertussis. Infants are most at risk for severe, life-threatening complications frompertussis. Adults who have never received Tdap should get a dose of Tdap. Also, adults should receive a booster dose of either Tdap or Td  (a different vaccine that protects against tetanus and diphtheria but not pertussis) every 10 years, or after 5 years in the case of a severe or dirty wound or burn. Tdap may be given at the same time as other vaccines. 3. Talk with your health care provider Tell your vaccine provider if the person getting the vaccine: Has had an allergic reaction after a previous dose of any vaccine that protects against tetanus, diphtheria, or pertussis, or has any severe, life-threatening allergies Has had a coma, decreased level of consciousness, or prolonged seizures within 7 days after a previous dose of any pertussis vaccine (DTP, DTaP, or Tdap) Has seizures or another nervous system problem Has ever had Guillain-Barr Syndrome (also called "GBS") Has had severe pain or swelling after a previous dose of any vaccine that protects against tetanus or diphtheria In some cases, your health care provider may decide to postpone Tdapvaccination until a future visit. People with minor illnesses, such as a cold, may be vaccinated. People who are moderately or severely ill should usually wait until they recover beforegetting Tdap vaccine.  Your health care provider can give you more information. 4. Risks of a vaccine reaction Pain, redness, or swelling where the shot was given, mild fever, headache, feeling tired, and nausea, vomiting, diarrhea, or stomachache sometimes happen after Tdap vaccination. People sometimes faint after medical procedures, including vaccination. Tellyour provider if you feel dizzy or have vision changes or ringing in the ears.  As with any medicine, there is a very remote chance of a vaccine causing asevere allergic reaction, other serious injury, or death. 5. What if  there is a serious problem? An allergic reaction could occur after the vaccinated person leaves the clinic. If you see signs of a severe allergic reaction (hives, swelling of the face and throat, difficulty breathing, a fast  heartbeat, dizziness, or weakness), call 9-1-1and get the person to the nearest hospital. For other signs that concern you, call your health care provider.  Adverse reactions should be reported to the Vaccine Adverse Event Reporting System (VAERS). Your health care provider will usually file this report, or you can do it yourself. Visit the VAERS website at www.vaers.SamedayNews.es or call (787)745-9621. VAERS is only for reporting reactions, and VAERS staff members do not give medical advice. 6. The National Vaccine Injury Compensation Program The [employer] Vaccine Injury Compensation Program (VICP) is a federal program that was created to compensate people who may have been injured by certain vaccines. Claims regarding alleged injury or death due to vaccination have a time limit for filing, which may be as short as two years. Visit the VICP website at GoldCloset.com.ee or call 2366140700to learn about the program and about filing a claim. 7. How can I learn more? Ask your health care provider. Call your local or state health department. Visit the website of the Food and Drug Administration (FDA) for vaccine package inserts and additional information at TraderRating.uy. Contact the Centers for Disease Control and Prevention (CDC): Call 316-509-4305 (1-800-CDC-INFO) or Visit CDC's website at http://hunter.com/. Vaccine Information Statement Tdap (Tetanus, Diphtheria, Pertussis) Vaccine(12/28/2019) This information is not intended to replace advice given to you by your health care provider. Make sure you discuss any questions you have with your healthcare provider. Document Revised: 01/23/2020 Document Reviewed: 01/23/2020 Elsevier Patient Education  2022 [place].   Dysfunctional Uterine Bleeding Dysfunctional uterine bleeding is abnormal bleeding from the uterus. Dysfunctional uterine bleeding includes: A menstrual period that comes earlier or  later than usual. A menstrual period that is lighter or heavier than usual, or has large blood clots. Vaginal bleeding between menstrual periods. Skipping one or more menstrual periods. Vaginal bleeding after sex. Vaginal bleeding after menopause. Follow these instructions at home: Eating and drinking  Eat well-balanced meals. Include foods that are high in iron, such as liver, meat, shellfish, green leafy vegetables, and eggs. To prevent or treat constipation, your health care provider may recommend that you: Drink enough fluid to keep your urine pale yellow. Take over-the-counter or prescription medicines. Eat foods that are high in fiber, such as beans, whole grains, and fresh fruits and vegetables. Limit foods that are high in fat and processed sugars, such as fried or sweet foods.  Medicines Take over-the-counter and prescription medicines only as told by your health care provider. Do not change medicines without talking with your health care provider. Aspirin or medicines that contain aspirin may make the bleeding worse. Do not take those medicines: During the week before your menstrual period. During your menstrual period. If you were prescribed iron pills, take them as told by your health care provider. Iron pills help to replace iron that your body loses because of this condition. Activity If you need to change your sanitary pad or tampon more than one time every 2 hours: Lie in bed with your feet raised (elevated). Place a cold pack on your lower abdomen. Rest as much as possible until the bleeding stops or slows down. Do not try to lose weight until the bleeding has stopped and your blood iron level is back to normal. General instructions  For two months, write down:  When your menstrual period starts. When your menstrual period ends. When any abnormal vaginal bleeding occurs. What problems you notice. Keep all follow up visits as told by your health care provider. This  is important.  Contact a health care provider if you: Feel light-headed or weak. Have nausea and vomiting. Cannot eat or drink without vomiting. Feel dizzy or have diarrhea while you are taking medicines. Are taking birth control pills or hormones, and you want to change them or stop taking them. Get help right away if: You develop a fever or chills. You need to change your sanitary pad or tampon more than one time per hour. Your vaginal bleeding becomes heavier, or your flow contains clots more often. You develop pain in your abdomen. You lose consciousness. You develop a rash. Summary Dysfunctional uterine bleeding is abnormal bleeding from the uterus. It includes menstrual bleeding of abnormal duration, volume, or regularity. Bleeding after sex and after menopause are also considered dysfunctional uterine bleeding. This information is not intended to replace advice given to you by your health care provider. Make sure you discuss any questions you have with your healthcare provider. Document Revised: 10/19/2017 Document Reviewed: 10/19/2017 Elsevier Patient Education  2022 [place].  Abnormal Uterine Bleeding  Abnormal uterine bleeding is unusual bleeding from the uterus. It includes bleeding after sex, or bleeding or spotting between menstrual periods. It may also include bleeding that is heavier than normal, menstrual periods that lastlonger than usual, or bleeding that occurs after menopause. Abnormal uterine bleeding can affect teenagers, women in their reproductive years, pregnant women, and women who have reached menopause. Common causes of abnormal uterine bleeding include: Pregnancy. Growths of tissue (polyps). Benign tumors or growths in the uterus (fibroids). These are not cancer. Infection. Cancer. Too much or too little of some hormones in the body (hormonal imbalances). Any type of abnormal bleeding should be checked by a health care provider. Many cases are minor  and simple to treat, but others may be more serious. Treatmentwill depend on the cause and severity of the bleeding. Follow these instructions at home: Medicines Take over-the-counter and prescription medicines only as told by your health care provider. Tell your health care provider about other medicines that you take. You may be asked to stop taking aspirin or medicines that contain aspirin. These medicines can make bleeding worse. If you were prescribed iron pills, take them as told by your health care provider. Iron pills help to replace iron that your body loses because of this condition. Managing constipation In cases of severe bleeding, you may be asked to increase your iron intake to treat anemia. This may cause constipation. To prevent or treat constipation, you may need to: Drink enough fluid to keep your urine pale yellow. Take over-the-counter or prescription medicines. Eat foods that are high in fiber, such as beans, whole grains, and fresh fruits and vegetables. Limit foods that are high in fat and processed sugars, such as fried or sweet foods. General instructions Monitor your condition for any changes. Do not use tampons, douche, or have sex until your health care provider says these things are okay. Change your pads often. Get regular exams. This includes pelvic exams and cervical cancer screenings. It is up to you to get the results of any tests that are done. Ask your health care provider, or the department that is doing the tests, when your results will be ready. Keep all follow-up visits as told by your health care provider. This is important. Contact  a health care provider if you: Have bleeding that lasts for more than 1 week. Feel dizzy at times. Feel nauseous or you vomit. Feel light-headed or weak. Notice any other changes that show that your condition is getting worse. Get help right away if you: Pass out. Have bleeding that soaks through a pad every hour. Have  pain in the abdomen. Have a fever or chills. Become sweaty or weak. Pass large blood clots from your vagina. Summary Abnormal uterine bleeding is unusual bleeding from the uterus. Any type of abnormal bleeding should be evaluated by a health care provider. Many cases are minor and simple to treat, but others may be more serious. Treatment will depend on the cause of the bleeding. Get help right away if you pass out, you have bleeding that soaks through a pad every hour, or you pass large blood clots from your vagina. This information is not intended to replace advice given to you by your health care provider. Make sure you discuss any questions you have with your healthcare provider. Document Revised: 01/16/2020 Document Reviewed: 03/13/2019 Elsevier Patient Education  [place].

## 2020-12-02 DIAGNOSIS — Z5181 Encounter for therapeutic drug level monitoring: Secondary | ICD-10-CM | POA: Diagnosis not present

## 2021-02-03 DIAGNOSIS — O903 Peripartum cardiomyopathy: Secondary | ICD-10-CM | POA: Diagnosis not present

## 2021-02-03 DIAGNOSIS — I10 Essential (primary) hypertension: Secondary | ICD-10-CM | POA: Diagnosis not present

## 2021-03-16 ENCOUNTER — Telehealth (INDEPENDENT_AMBULATORY_CARE_PROVIDER_SITE_OTHER): Payer: BC Managed Care – PPO | Admitting: Nurse Practitioner

## 2021-03-16 ENCOUNTER — Encounter: Payer: Self-pay | Admitting: Nurse Practitioner

## 2021-03-16 VITALS — BP 123/70 | HR 105 | Temp 97.3°F

## 2021-03-16 DIAGNOSIS — R0981 Nasal congestion: Secondary | ICD-10-CM | POA: Diagnosis not present

## 2021-03-16 DIAGNOSIS — R051 Acute cough: Secondary | ICD-10-CM | POA: Insufficient documentation

## 2021-03-16 DIAGNOSIS — J069 Acute upper respiratory infection, unspecified: Secondary | ICD-10-CM | POA: Insufficient documentation

## 2021-03-16 MED ORDER — BENZONATATE 200 MG PO CAPS: 200.0000 mg | ORAL_CAPSULE | Freq: Two times a day (BID) | ORAL | 0 refills | Status: DC | PRN

## 2021-03-16 MED ORDER — FLUTICASONE PROPIONATE 50 MCG/ACT NA SUSP: 2.0000 | Freq: Every day | NASAL | 0 refills | Status: AC

## 2021-03-16 MED ORDER — GUAIFENESIN-CODEINE 100-10 MG/5ML PO SOLN: 5.0000 mL | Freq: Every evening | ORAL | 0 refills | Status: AC | PRN

## 2021-03-16 NOTE — Assessment & Plan Note (Signed)
We will start Tessalon Perles 200 mg twice daily along with codeine/guaifenesin cough syrup for nighttime use to aid with sleep.  Continue to monitor

## 2021-03-16 NOTE — Assessment & Plan Note (Signed)
Patient experiencing likely viral illness.  Discussed this with patient.  States that she has been using Tylenol severe sinus.  Did discuss with her that the phenylephrine is not great in people with high blood pressure discouraged use patient acknowledged.  We will send in medications for symptomatic treatment patient will reach out if she is getting better towards the end of the week and we can reevaluate.  We will send a note via MyChart now that may need to be addended later.  Continue to monitor.  Signs and symptoms discussed as when to seek urgent or emergent health care.  Patient knowledge

## 2021-03-16 NOTE — Assessment & Plan Note (Signed)
Patient complains of nasal congestion.  We will place her on antihistamine and Flonase. Start Flonase 2 sprays each nostril daily.  Continue to monitor

## 2021-03-16 NOTE — Progress Notes (Signed)
Patient ID: Stacie Gill, female    DOB: 27-May-1975, 45 y.o.   MRN: [id number]  Virtual visit completed through [employer], a video enabled telemedicine application. Due to national recommendations of social distancing due to COVID-19, a virtual visit is felt to be most appropriate for this patient at this time. Reviewed limitations, risks, security and privacy concerns of performing a virtual visit and the availability of in person appointments. I also reviewed that there may be a patient responsible charge related to this service. The patient agreed to proceed.   Patient location: home Provider location: [place] [facility], office Persons participating in this virtual visit: patient, provider   If any vitals were documented, they were collected by patient at home unless specified below.    BP 123/70   Pulse (!) 105   Temp (!) 97.3 F (36.3 C)    CC: Cough Subjective:   HPI: Stacie Gill is a 45 y.o. female presenting on 03/16/2021 for Sore Throat (Started with scratchy throat, cough around 10/19 or 10/20, then on 10/22-started having sinus pressure/pain, head congestion, post nasal drip, pressure behind ears, runny nose/nasal congestion, some sneezing. No fever, no body aches, or chills. Covid test at home was negative on 03/15/21)  Symptoms started around10/20 started irratiated throat. Covid test negative  Vaccinated Sport and exercise psychologist  Mucinex and tylenol sinus severe with minimal releif Yellow mucous currently   Relevant past medical, surgical, family and social history reviewed and updated as indicated. Interim medical history since our last visit reviewed. Allergies and medications reviewed and updated. Outpatient Medications Prior to Visit  Medication Sig Dispense Refill   clindamycin (CLEOCIN T) 1 % lotion Apply topically 2 (two) times daily. Bid under arms 60 mL 11   empagliflozin (JARDIANCE) 10 MG TABS tablet Take 1 tablet by mouth daily.     Esomeprazole  Magnesium (NEXIUM PO) Take by mouth as needed.     fexofenadine (ALLEGRA) 180 MG tablet Take 180 mg by mouth daily.     fluticasone (FLONASE) 50 MCG/ACT nasal spray Place into both nostrils daily.     hydrochlorothiazide (HYDRODIURIL) 25 MG tablet Take 25 mg by mouth daily.     levonorgestrel (MIRENA) 20 MCG/24HR IUD 1 each by Intrauterine route once.     metoprolol succinate (TOPROL-XL) 25 MG 24 hr tablet Take 1 tablet by mouth in the morning and at bedtime.     ramipril (ALTACE) 10 MG capsule Take 2 capsules (20 mg total) by mouth daily.     valACYclovir (VALTREX) 500 MG tablet Take by mouth.     metoprolol succinate (TOPROL-XL) 25 MG 24 hr tablet Take 1 tablet (25 mg total) by mouth daily. (Patient taking differently: Take 25 mg by mouth in the morning and at bedtime.) 90 tablet 3   cyanocobalamin (,VITAMIN B-12,) 1000 MCG/ML injection Inject 1 mL (1,000 mcg total) into the muscle every 30 (thirty) days. (Patient not taking: Reported on 03/16/2021) 1 mL 0   No facility-administered medications prior to visit.     Per HPI unless specifically indicated in ROS section below Review of Systems  Constitutional:  Negative for chills, fatigue and fever.  HENT:  Positive for congestion, ear pain (ear pressure), sinus pressure and sore throat.   Respiratory:  Positive for cough. Negative for shortness of breath.   Cardiovascular:  Negative for chest pain.  Gastrointestinal:  Negative for diarrhea, nausea and vomiting.  Musculoskeletal:  Negative for arthralgias and myalgias.  Neurological:  Positive for headaches.  Objective:  BP 123/70   Pulse (!) 105   Temp (!) 97.3 F (36.3 C)   Wt Readings from Last 3 Encounters:  11/07/20 242 lb 9.6 oz (110 kg)  08/26/20 247 lb 7 oz (112.2 kg)  06/06/20 240 lb (108.9 kg)       Physical exam: Gen: alert, NAD, not ill appearing Pulm: speaks in complete sentences without increased work of breathing Psych: normal mood, normal thought content       Results for orders placed or performed in visit on 08/26/20  HM PAP SMEAR  Result Value Ref Range   HM Pap smear normal    Assessment & Plan:   Problem List Items Addressed This Visit       Respiratory   Upper respiratory tract infection    Patient experiencing likely viral illness.  Discussed this with patient.  States that she has been using Tylenol severe sinus.  Did discuss with her that the phenylephrine is not great in people with high blood pressure discouraged use patient acknowledged.  We will send in medications for symptomatic treatment patient will reach out if she is getting better towards the end of the week and we can reevaluate.  We will send a note via MyChart now that may need to be addended later.  Continue to monitor.  Signs and symptoms discussed as when to seek urgent or emergent health care.  Patient knowledge        Other   Acute cough - Primary    We will start Tessalon Perles 200 mg twice daily along with codeine/guaifenesin cough syrup for nighttime use to aid with sleep.  Continue to monitor      Relevant Medications   benzonatate (TESSALON) 200 MG capsule   guaiFENesin-codeine 100-10 MG/5ML syrup   Nasal congestion    Patient complains of nasal congestion.  We will place her on antihistamine and Flonase. Start Flonase 2 sprays each nostril daily.  Continue to monitor      Relevant Medications   fluticasone (FLONASE) 50 MCG/ACT nasal spray     No orders of the defined types were placed in this encounter.  No orders of the defined types were placed in this encounter.   I discussed the assessment and treatment plan with the patient. The patient was provided an opportunity to ask questions and all were answered. The patient agreed with the plan and demonstrated an understanding of the instructions. The patient was advised to call back or seek an in-person evaluation if the symptoms worsen or if the condition fails to improve as anticipated.  Follow  up plan: No follow-ups on file.  Romilda Garret, NP

## 2021-03-18 ENCOUNTER — Encounter: Payer: Self-pay | Admitting: Nurse Practitioner

## 2021-03-20 ENCOUNTER — Encounter: Payer: Self-pay | Admitting: Internal Medicine

## 2021-03-23 ENCOUNTER — Telehealth: Payer: Self-pay | Admitting: Internal Medicine

## 2021-03-23 ENCOUNTER — Other Ambulatory Visit: Payer: Self-pay | Admitting: Nurse Practitioner

## 2021-03-23 ENCOUNTER — Encounter: Payer: Self-pay | Admitting: Nurse Practitioner

## 2021-03-23 MED ORDER — AMOXICILLIN-POT CLAVULANATE 875-125 MG PO TABS: 1.0000 | ORAL_TABLET | Freq: Two times a day (BID) | ORAL | 0 refills | Status: AC

## 2021-03-23 MED ORDER — PREDNISONE 20 MG PO TABS: ORAL_TABLET | ORAL | 0 refills | Status: AC

## 2021-03-23 NOTE — Telephone Encounter (Signed)
Please re-address as pt still stating not better thank you since you evaluated her but I am PCP take care Sorry to hear this if you are not better I would call the provider you saw at the [facility] location again and mention that and have then consider an antibiotic and test for covid 19  Also consider follow up with ENT for your symptoms  Ive sent him a message as well  Feel better soon   ===View-only below this line===   ----- Message -----      From:Stacie Gill      Sent:03/20/2021  1:41 PM EDT        AU:QJFHL N McLean-Scocuzza, MD   Subject:Upper respiratory infection   I had a virtual appointment with one of colleagues in the [place] office location this past Monday. I informed him that symptoms started with a scratchy throat the previous Wednesday followed by  coughing on Friday and all other symptoms developing on Saturday.  I had congestion, coughing, runny nose and headache, sinus pressure with lots of pressure behind the ears. He called in cough meds but not an antibiotic. I sent him a message this past Wensday to let him know symptoms persisted and smell and taste had been affected. I also had partially lost my voice. He said he felt I would be fine since some symptoms have improved. However I am still concerned because I still having a lot of pressure behind my ears, esp my left ear, despite the fact that I am using Flonase. I am still constantly blowing my nose as well as I am sill coughing despite taking Benzonatate. I wanted to get your opinion on this or see if you think I would benefit from an antibiotic. I know these things take time,  however I don't want this to linger and turn into something else. My ear is really bothering me. My husband is still under his three month mark of his kidney transplant, the longer I am sick the longer I am putting him at risk. 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

## 2021-03-23 NOTE — Telephone Encounter (Signed)
Given patients symptoms are continuing and not truly improving will treat with abx and some prednisone to help with the hoarseness. I called and discussed this with patient. She also took 2 at home covid tests today and both came back negative. Continue to monitor and f/u if no improvement

## 2021-04-02 DIAGNOSIS — F331 Major depressive disorder, recurrent, moderate: Secondary | ICD-10-CM | POA: Diagnosis not present

## 2021-04-02 DIAGNOSIS — F411 Generalized anxiety disorder: Secondary | ICD-10-CM | POA: Diagnosis not present

## 2021-04-07 ENCOUNTER — Encounter: Payer: Self-pay | Admitting: Internal Medicine

## 2021-04-08 DIAGNOSIS — F331 Major depressive disorder, recurrent, moderate: Secondary | ICD-10-CM | POA: Diagnosis not present

## 2021-04-08 DIAGNOSIS — F411 Generalized anxiety disorder: Secondary | ICD-10-CM | POA: Diagnosis not present

## 2021-04-14 DIAGNOSIS — F331 Major depressive disorder, recurrent, moderate: Secondary | ICD-10-CM | POA: Diagnosis not present

## 2021-04-14 DIAGNOSIS — F411 Generalized anxiety disorder: Secondary | ICD-10-CM | POA: Diagnosis not present

## 2021-04-21 ENCOUNTER — Ambulatory Visit (INDEPENDENT_AMBULATORY_CARE_PROVIDER_SITE_OTHER): Payer: BC Managed Care – PPO

## 2021-04-21 ENCOUNTER — Other Ambulatory Visit: Payer: Self-pay

## 2021-04-21 DIAGNOSIS — E538 Deficiency of other specified B group vitamins: Secondary | ICD-10-CM

## 2021-04-21 MED ORDER — CYANOCOBALAMIN 1000 MCG/ML IJ SOLN
1000.0000 ug | Freq: Once | INTRAMUSCULAR | Status: AC
  Administered 2021-04-21: 1000 ug via INTRAMUSCULAR

## 2021-04-21 NOTE — Progress Notes (Signed)
Patient presented for B 12 injection to left deltoid, patient voiced no concerns nor showed any signs of distress during injection. 

## 2021-04-22 DIAGNOSIS — F411 Generalized anxiety disorder: Secondary | ICD-10-CM | POA: Diagnosis not present

## 2021-04-22 DIAGNOSIS — F331 Major depressive disorder, recurrent, moderate: Secondary | ICD-10-CM | POA: Diagnosis not present

## 2021-05-05 ENCOUNTER — Other Ambulatory Visit: Payer: Self-pay

## 2021-05-05 ENCOUNTER — Ambulatory Visit (INDEPENDENT_AMBULATORY_CARE_PROVIDER_SITE_OTHER): Payer: BC Managed Care – PPO

## 2021-05-05 DIAGNOSIS — E538 Deficiency of other specified B group vitamins: Secondary | ICD-10-CM

## 2021-05-05 MED ORDER — CYANOCOBALAMIN 1000 MCG/ML IJ SOLN
1000.0000 ug | Freq: Once | INTRAMUSCULAR | Status: AC
  Administered 2021-05-05: 1000 ug via INTRAMUSCULAR

## 2021-05-05 NOTE — Progress Notes (Signed)
Patient presented for B 12 injection to left deltoid, patient voiced no concerns nor showed any signs of distress during injection. 

## 2021-05-06 DIAGNOSIS — F331 Major depressive disorder, recurrent, moderate: Secondary | ICD-10-CM | POA: Diagnosis not present

## 2021-05-12 ENCOUNTER — Encounter: Payer: Self-pay | Admitting: Internal Medicine

## 2021-05-12 ENCOUNTER — Other Ambulatory Visit: Payer: Self-pay

## 2021-05-12 ENCOUNTER — Ambulatory Visit (INDEPENDENT_AMBULATORY_CARE_PROVIDER_SITE_OTHER): Payer: BC Managed Care – PPO | Admitting: Internal Medicine

## 2021-05-12 VITALS — BP 122/80 | HR 106 | Temp 97.5°F | Ht 64.0 in | Wt 240.2 lb

## 2021-05-12 DIAGNOSIS — Z1389 Encounter for screening for other disorder: Secondary | ICD-10-CM

## 2021-05-12 DIAGNOSIS — E538 Deficiency of other specified B group vitamins: Secondary | ICD-10-CM

## 2021-05-12 DIAGNOSIS — I10 Essential (primary) hypertension: Secondary | ICD-10-CM

## 2021-05-12 DIAGNOSIS — Z23 Encounter for immunization: Secondary | ICD-10-CM | POA: Diagnosis not present

## 2021-05-12 DIAGNOSIS — Z1231 Encounter for screening mammogram for malignant neoplasm of breast: Secondary | ICD-10-CM

## 2021-05-12 DIAGNOSIS — E559 Vitamin D deficiency, unspecified: Secondary | ICD-10-CM

## 2021-05-12 DIAGNOSIS — Z0000 Encounter for general adult medical examination without abnormal findings: Secondary | ICD-10-CM

## 2021-05-12 DIAGNOSIS — R7303 Prediabetes: Secondary | ICD-10-CM

## 2021-05-12 DIAGNOSIS — J039 Acute tonsillitis, unspecified: Secondary | ICD-10-CM | POA: Diagnosis not present

## 2021-05-12 DIAGNOSIS — R000 Tachycardia, unspecified: Secondary | ICD-10-CM

## 2021-05-12 DIAGNOSIS — Z1329 Encounter for screening for other suspected endocrine disorder: Secondary | ICD-10-CM

## 2021-05-12 MED ORDER — METOPROLOL SUCCINATE ER 25 MG PO TB24: 25.0000 mg | ORAL_TABLET | Freq: Two times a day (BID) | ORAL | 3 refills | Status: DC

## 2021-05-12 MED ORDER — AZITHROMYCIN 250 MG PO TABS: ORAL_TABLET | ORAL | 0 refills | Status: AC

## 2021-05-12 MED ORDER — TETANUS-DIPHTH-ACELL PERTUSSIS 5-2.5-18.5 LF-MCG/0.5 IM SUSP: 0.5000 mL | Freq: Once | INTRAMUSCULAR | 0 refills | Status: AC

## 2021-05-12 NOTE — Patient Instructions (Addendum)
How does he feel about colonoscopy and where do you want to go [facility] in [place] or [place] or [facility] in [place] surgery Dr. Harle Stanford in [place] or Dr. Claudia Desanctis in [place]   Call him and let him know you didn't get [place] [employer] antibacterial bodywash    Sacubitril; Valsartan Oral Tablets What is this medication? SACUBITRIL; VALSARTAN (sak UE bi tril; val SAR tan) is a combination of a neprilysin inhibitor and a an angiotensin II receptor blocker. It treats heart failure. This medicine may be used for other purposes; ask your health care provider or pharmacist if you have questions. COMMON BRAND NAME(S): Entresto What should I tell my care team before I take this medication? They need to know if you have any of these conditions: diabetes and take a medicine that contains aliskiren high levels of potassium in the blood kidney disease liver disease low blood pressure an unusual or allergic reaction to sacubitril; valsartan, drugs called angiotensin converting enzyme (ACE) inhibitors, angiotensin II receptor blockers (ARBs), other medicines, foods, dyes, or preservatives pregnant or trying to get pregnant breast-feeding How should I use this medication? Take this medicine by mouth. Take it as directed on the prescription label at the same time every day. You can take it with or without food. If it upsets your stomach, take it with food. Keep taking it unless your health care provider tells you to stop. Talk to your health care provider about the use of this drug in children. While it may be prescribed for children as young as 1 for selected conditions, precautions do apply. Overdosage: If you think you have taken too much of this medicine contact a poison control center or emergency room at once. NOTE: This medicine is only for you. Do not share this medicine with others. What if I miss a dose? If you miss a dose, take it as soon as you can. If it is almost time for your  next dose, take only that dose. Do not take double or extra doses. What may interact with this medication? Do not take this medicine with any of the following medicines: aliskiren if you have diabetes angiotensin-converting enzyme (ACE) inhibitors, like benazepril, captopril, enalapril, fosinopril, lisinopril, or ramipril tranylcypromine This medicine may also interact with the following medicines: angiotensin II receptor blockers (ARBs) like azilsartan, candesartan, eprosartan, irbesartan, losartan, olmesartan, telmisartan, or valsartan celecoxib lithium NSAIDS, medicines for pain and inflammation, like ibuprofen or naproxen potassium-sparing diuretics like amiloride, spironolactone, and triamterene potassium supplements This list may not describe all possible interactions. Give your health care provider a list of all the medicines, herbs, non-prescription drugs, or dietary supplements you use. Also tell them if you smoke, drink alcohol, or use illegal drugs. Some items may interact with your medicine. What should I watch for while using this medication? Tell your doctor or health care provider if your symptoms do not start to get better or if they get worse. Do not become pregnant while taking this medicine. Women should inform their health care provider if they wish to become pregnant or think they might be pregnant. There is a potential for serious harm to an unborn child. Talk to your health care provider for more information. You may get drowsy or dizzy. Do not drive, use machinery, or do anything that needs mental alertness until you know how this medicine affects you. Do not stand or sit up quickly, especially if you are an older patient. This reduces the risk of dizzy or fainting  spells. Alcohol may interfere with the effects of this medicine. Avoid alcoholic drinks. Avoid salt substitutes unless you are told otherwise by your health care provider. What side effects may I notice from  receiving this medication? Side effects that you should report to your doctor or health care provider as soon as possible: allergic reactions (skin rash, itching or hives; swelling of the face, lips, or tongue) high potassium levels (chest pain; fast, irregular heartbeat; muscle weakness) kidney injury (trouble passing urine or change in the amount of urine) low blood pressure (dizziness; feeling faint or lightheaded, falls; unusually weak or tired) Side effects that usually do not require medical attention (report to your doctor or health care provider if they continue or are bothersome): cough This list may not describe all possible side effects. Call your doctor for medical advice about side effects. You may report side effects to FDA at 1-800-FDA-1088. Where should I keep my medication? Keep out of the reach of children and pets. Store at room temperature between 20 and 25 degrees C (68 and 77 degrees F). Protect from moisture. Keep the container tightly closed. Get rid of any unused medicine after the expiration date. To get rid of medicines that are no longer needed or have expired: Take the medicine to a take-back program. Check with your pharmacy or law enforcement to find a location. If you cannot return the medicine, check the label or package insert to see if the medicine should be thrown out in the garbage or flushed down the toilet. If you are not sure, ask your health care provider. If it is safe to put it in the trash, empty the medicine out of the container. Mix the medicine with cat litter, dirt, coffee grounds, or other unwanted substance. Seal the mixture in a bag or container. Put it in the trash. NOTE: This sheet is a summary. It may not cover all possible information. If you have questions about this medicine, talk to your doctor, pharmacist, or health care provider.  2022 Elsevier/Gold Standard (2021-01-27 00:00:00)

## 2021-05-12 NOTE — Progress Notes (Signed)
Chief Complaint  Patient presents with   Annual Exam   F/u 1. Recurrent tonsillitis sick and seen by another provider weeks to months ago family daughters x 2 recently tested + covid 19  White lesions in back of throat and pressure in throat covid 19 test negative this am   2. Echo ef 35% 01/2021 on toprol 25 xl bid added jardiance 10 and altace 10 mg qd and Dr. Edwin Dada [facility] considering entresto  Htn controlled today on above and hctz 25 Dr. Edwin Dada note disc consider spironolactone as well   Review of Systems  Constitutional:  Negative for weight loss.  HENT:  Negative for hearing loss.        White exudate b/l tonsils    Eyes:  Negative for blurred vision.  Respiratory:  Negative for shortness of breath.   Cardiovascular:  Negative for chest pain.  Gastrointestinal:  Negative for abdominal pain and blood in stool.  Genitourinary:  Negative for dysuria.  Musculoskeletal:  Negative for falls and joint pain.  Skin:  Negative for rash.  Neurological:  Negative for headaches.  Psychiatric/Behavioral:  Negative for depression.   Past Medical History:  Diagnosis Date   Cardiomyopathy ([place])    Chickenpox    Fibroids    Focal nodular hyperplasia of liver    GERD (gastroesophageal reflux disease)    Hypertension    Prediabetes    Strep throat    UTI (lower urinary tract infection)    Past Surgical History:  Procedure Laterality Date   CHOLECYSTECTOMY  2003   Family History  Problem Relation Age of Onset   Heart disease Mother    Prostate cancer Father    Healthy Daughter    Uterine cancer Maternal Aunt    Breast cancer Cousin    Alcoholism Other        Uncle   Arthritis Other        Grandparent   Colon cancer Other        Great grandparent, great uncle   Stroke Other        Great grandparent   Hypertension Other        Parent   Bone cancer Other        Uncle   Social History   Socioeconomic History   Marital status: Married    Spouse name: Not on file   Number  of children: Not on file   Years of education: Not on file   Highest education level: Not on file  Occupational History   Not on file  Tobacco Use   Smoking status: Never   Smokeless tobacco: Never  Substance and Sexual Activity   Alcohol use: Yes    Alcohol/week: 0.0 standard drinks    Comment: 1 a month, rare   Drug use: No   Sexual activity: Not on file  Other Topics Concern   Not on file  Social History Narrative   Lives with husband and 2 children in a one story home.     Works at call center.  Education: associates degree.   Husband as of 05/02/19 on dialysis    Social Determinants of Health   Financial Resource Strain: Not on file  Food Insecurity: Not on file  Transportation Needs: Not on file  Physical Activity: Not on file  Stress: Not on file  Social Connections: Not on file  Intimate Partner Violence: Not on file   Current Meds  Medication Sig   Ascorbic Acid (VITAMIN C PO) Take by  mouth daily.   azithromycin (ZITHROMAX) 250 MG tablet Take 2 tablets on day 1, then 1 tablet daily on days 2 through 5 with food   clindamycin (CLEOCIN T) 1 % lotion Apply topically 2 (two) times daily. Bid under arms   cyanocobalamin (,VITAMIN B-12,) 1000 MCG/ML injection Inject 1 mL (1,000 mcg total) into the muscle every 30 (thirty) days.   empagliflozin (JARDIANCE) 10 MG TABS tablet Take 1 tablet by mouth daily.   Esomeprazole Magnesium (NEXIUM PO) Take by mouth as needed.   fexofenadine (ALLEGRA) 180 MG tablet Take 180 mg by mouth daily.   fluticasone (FLONASE) 50 MCG/ACT nasal spray Place 2 sprays into both nostrils daily.   hydrochlorothiazide (HYDRODIURIL) 25 MG tablet Take 25 mg by mouth daily.   levonorgestrel (MIRENA) 20 MCG/24HR IUD 1 each by Intrauterine route once.   Multiple Vitamins-Minerals (ZINC PO) Take by mouth daily.   ramipril (ALTACE) 10 MG capsule Take 2 capsules (20 mg total) by mouth daily.   Tdap (BOOSTRIX) 5-2.5-18.5 LF-MCG/0.5 injection Inject 0.5 mLs  into the muscle once for 1 dose.   valACYclovir (VALTREX) 500 MG tablet Take by mouth.   [DISCONTINUED] metoprolol succinate (TOPROL-XL) 25 MG 24 hr tablet Take 1 tablet by mouth in the morning and at bedtime.   Allergies  Allergen Reactions   Coreg [Carvedilol]     sob   Norvasc [Amlodipine]     5 mg leg swelling    No results found for this or any previous visit (from the past 2160 hour(s)). Objective  Body mass index is 41.23 kg/m. Wt Readings from Last 3 Encounters:  05/12/21 240 lb 3.2 oz (109 kg)  11/07/20 242 lb 9.6 oz (110 kg)  08/26/20 247 lb 7 oz (112.2 kg)   Temp Readings from Last 3 Encounters:  05/12/21 (!) 97.5 F (36.4 C) (Temporal)  03/16/21 (!) 97.3 F (36.3 C)  11/07/20 98.4 F (36.9 C) (Oral)   BP Readings from Last 3 Encounters:  05/12/21 122/80  03/16/21 123/70  11/07/20 124/80   Pulse Readings from Last 3 Encounters:  05/12/21 (!) 106  03/16/21 (!) 105  11/07/20 87    Physical Exam Vitals and nursing note reviewed.  Constitutional:      Appearance: Normal appearance. She is well-developed and well-groomed.  HENT:     Head: Normocephalic and atraumatic.  Eyes:     Conjunctiva/sclera: Conjunctivae normal.     Pupils: Pupils are equal, round, and reactive to light.  Cardiovascular:     Rate and Rhythm: Normal rate and regular rhythm.     Heart sounds: Normal heart sounds. No murmur heard. Pulmonary:     Effort: Pulmonary effort is normal.     Breath sounds: Normal breath sounds.  Abdominal:     General: Abdomen is flat. Bowel sounds are normal.     Tenderness: There is no abdominal tenderness.  Musculoskeletal:        General: No tenderness.  Skin:    General: Skin is warm and dry.  Neurological:     General: No focal deficit present.     Mental Status: She is alert and oriented to person, place, and time. Mental status is at baseline.     Cranial Nerves: Cranial nerves 2-12 are intact.     Gait: Gait is intact.  Psychiatric:         Attention and Perception: Attention and perception normal.        Mood and Affect: Mood and affect normal.  Speech: Speech normal.        Behavior: Behavior normal. Behavior is cooperative.        Thought Content: Thought content normal.        Cognition and Memory: Cognition and memory normal.        Judgment: Judgment normal.    Assessment  Plan  Annual  -see below   Tonsillitis - Plan: azithromycin (ZITHROMAX) 250 MG tablet   Sinus tachycardia - Plan: metoprolol succinate (TOPROL-XL) 25 MG 24 hr tablet, Hemoglobin A1c  Prediabetes - Plan: Hemoglobin A1c  Hypertension, controlled on meds - Plan: Comprehensive metabolic panel, Lipid panel, CBC with Differential/Platelet toprol 25 xl bid added jardiance 10 and altace 10 mg qd and Dr. Edwin Dada [facility] considering entresto  Htn controlled today on above and hctz 25 Dr. Edwin Dada note disc consider spironolactone as well   HM Declines flu shot  Tdap will come back and get this rx Consider twinrix disc prev  mmr immune  covid 2/2 disc booster    OB/GYN appt sch for f/u ovarian cyst, fibroids now with BV as of 06/14/2018 [facility] -pap 06/02/17 neg pap neg HPV  Dr. Leonides Schanz labs had 07/08/20 pap neg pap neg HPV   mammo 07/30/20 negative  Dr. Leonides Schanz    Colonoscopy disc age 38 est [facility] Call back when ready for referral and let know where    Reviewed labs 07/08/20   Vit D 06/16/17 15.2 on 50K now off rec D3 4000 IU qd  Neg HIV 06/14/2018,  CMET, lipid, TSH, B12, vitamin D, GC/G from 06/14/2018 A1C 5.7 06/14/2018  Colonoscopy due    CT chest 06/20/2017 1.9 cm liver lesion 0.2 cm right lower lobe pulm nodule consider repeat CT chest in 1 year  CT chest 08/18/18 likely postinfectious benign nodules no change   MRI had ab/pelvis 08/03/19 repeat 07/2020    Of note B12 06/14/18 212, vitamin D 06/14/18 35.9 with h/o low vitmain D and B12   Echo repeat due 07/2021 Dr. Edwin Dada 01/2021 EF 35%  Provider: Dr. Olivia Mackie McLean-Scocuzza-[facility]

## 2021-05-19 ENCOUNTER — Ambulatory Visit (INDEPENDENT_AMBULATORY_CARE_PROVIDER_SITE_OTHER): Payer: BC Managed Care – PPO

## 2021-05-19 ENCOUNTER — Other Ambulatory Visit: Payer: Self-pay

## 2021-05-19 DIAGNOSIS — E538 Deficiency of other specified B group vitamins: Secondary | ICD-10-CM

## 2021-05-19 MED ORDER — CYANOCOBALAMIN 1000 MCG/ML IJ SOLN
1000.0000 ug | Freq: Once | INTRAMUSCULAR | Status: AC
  Administered 2021-05-19: 10:00:00 1000 ug via INTRAMUSCULAR

## 2021-05-19 NOTE — Progress Notes (Signed)
Patient came in today for B-12 injection given in left deltoid IM. Patient tolerated well with no signs of distress.

## 2021-05-22 ENCOUNTER — Encounter: Payer: Self-pay | Admitting: Internal Medicine

## 2021-05-26 ENCOUNTER — Encounter: Payer: Self-pay | Admitting: Internal Medicine

## 2021-05-26 NOTE — Telephone Encounter (Signed)
Please advise on Patient using cough suppressants

## 2021-06-02 ENCOUNTER — Ambulatory Visit (INDEPENDENT_AMBULATORY_CARE_PROVIDER_SITE_OTHER): Payer: BC Managed Care – PPO

## 2021-06-02 ENCOUNTER — Other Ambulatory Visit: Payer: Self-pay

## 2021-06-02 DIAGNOSIS — E538 Deficiency of other specified B group vitamins: Secondary | ICD-10-CM | POA: Diagnosis not present

## 2021-06-02 MED ORDER — CYANOCOBALAMIN 1000 MCG/ML IJ SOLN
1000.0000 ug | Freq: Once | INTRAMUSCULAR | Status: AC
  Administered 2021-06-02: 1000 ug via INTRAMUSCULAR

## 2021-06-02 NOTE — Progress Notes (Signed)
Pt presented today for b12 injection. Right deltoid. Tp tolerated injection well.

## 2021-06-16 ENCOUNTER — Ambulatory Visit (INDEPENDENT_AMBULATORY_CARE_PROVIDER_SITE_OTHER): Payer: BC Managed Care – PPO

## 2021-06-16 ENCOUNTER — Other Ambulatory Visit: Payer: Self-pay

## 2021-06-16 DIAGNOSIS — E538 Deficiency of other specified B group vitamins: Secondary | ICD-10-CM | POA: Diagnosis not present

## 2021-06-16 MED ORDER — CYANOCOBALAMIN 1000 MCG/ML IJ SOLN
1000.0000 ug | Freq: Once | INTRAMUSCULAR | Status: AC
  Administered 2021-06-16: 16:00:00 1000 ug via INTRAMUSCULAR

## 2021-06-16 NOTE — Progress Notes (Signed)
Stacie Gill presents today for injection per MD orders. B12 injection administered IM in left Upper Arm. Administration without incident. Patient tolerated well.  Lovetta Condie,cma

## 2021-06-30 ENCOUNTER — Ambulatory Visit (INDEPENDENT_AMBULATORY_CARE_PROVIDER_SITE_OTHER): Payer: BC Managed Care – PPO

## 2021-06-30 ENCOUNTER — Other Ambulatory Visit: Payer: Self-pay

## 2021-06-30 DIAGNOSIS — E538 Deficiency of other specified B group vitamins: Secondary | ICD-10-CM | POA: Diagnosis not present

## 2021-06-30 MED ORDER — CYANOCOBALAMIN 1000 MCG/ML IJ SOLN
1000.0000 ug | Freq: Once | INTRAMUSCULAR | Status: AC
  Administered 2021-06-30: 1000 ug via INTRAMUSCULAR

## 2021-06-30 NOTE — Progress Notes (Signed)
Pt presented today for b12 injection. Right deltoid, IM. Pt voiced no concerns nor showed any signs of distress.  

## 2021-07-08 ENCOUNTER — Other Ambulatory Visit: Payer: Self-pay

## 2021-07-08 ENCOUNTER — Other Ambulatory Visit (INDEPENDENT_AMBULATORY_CARE_PROVIDER_SITE_OTHER): Payer: BC Managed Care – PPO

## 2021-07-08 DIAGNOSIS — E559 Vitamin D deficiency, unspecified: Secondary | ICD-10-CM | POA: Diagnosis not present

## 2021-07-08 DIAGNOSIS — Z1329 Encounter for screening for other suspected endocrine disorder: Secondary | ICD-10-CM | POA: Diagnosis not present

## 2021-07-08 DIAGNOSIS — I10 Essential (primary) hypertension: Secondary | ICD-10-CM

## 2021-07-08 DIAGNOSIS — R7303 Prediabetes: Secondary | ICD-10-CM

## 2021-07-08 DIAGNOSIS — Z1389 Encounter for screening for other disorder: Secondary | ICD-10-CM | POA: Diagnosis not present

## 2021-07-08 DIAGNOSIS — E538 Deficiency of other specified B group vitamins: Secondary | ICD-10-CM

## 2021-07-08 DIAGNOSIS — R000 Tachycardia, unspecified: Secondary | ICD-10-CM

## 2021-07-08 LAB — CBC WITH DIFFERENTIAL/PLATELET
Basophils Absolute: 0 10*3/uL (ref 0.0–0.1)
Basophils Relative: 0.2 % (ref 0.0–3.0)
Eosinophils Absolute: 0.1 10*3/uL (ref 0.0–0.7)
Eosinophils Relative: 1.3 % (ref 0.0–5.0)
HCT: 38.2 % (ref 36.0–46.0)
Hemoglobin: 12 g/dL (ref 12.0–15.0)
Lymphocytes Relative: 41.6 % (ref 12.0–46.0)
Lymphs Abs: 3 10*3/uL (ref 0.7–4.0)
MCHC: 31.5 g/dL (ref 30.0–36.0)
MCV: 86.6 fl (ref 78.0–100.0)
Monocytes Absolute: 0.6 10*3/uL (ref 0.1–1.0)
Monocytes Relative: 7.8 % (ref 3.0–12.0)
Neutro Abs: 3.5 10*3/uL (ref 1.4–7.7)
Neutrophils Relative %: 49.1 % (ref 43.0–77.0)
Platelets: 267 10*3/uL (ref 150.0–400.0)
RBC: 4.41 Mil/uL (ref 3.87–5.11)
RDW: 15.5 % (ref 11.5–15.5)
WBC: 7.1 10*3/uL (ref 4.0–10.5)

## 2021-07-08 LAB — LIPID PANEL
Cholesterol: 155 mg/dL (ref 0–200)
HDL: 33.8 mg/dL — ABNORMAL LOW (ref >39.00)
LDL Cholesterol: 98 mg/dL (ref 0–99)
NonHDL: 120.82
Total CHOL/HDL Ratio: 5
Triglycerides: 114 mg/dL (ref 0.0–149.0)
VLDL: 22.8 mg/dL (ref 0.0–40.0)

## 2021-07-08 LAB — HEMOGLOBIN A1C: Hgb A1c MFr Bld: 5.9 % (ref 4.6–6.5)

## 2021-07-08 LAB — TSH: TSH: 1.7 u[IU]/mL (ref 0.35–5.50)

## 2021-07-08 LAB — COMPREHENSIVE METABOLIC PANEL
ALT: 12 U/L (ref 0–35)
AST: 11 U/L (ref 0–37)
Albumin: 3.9 g/dL (ref 3.5–5.2)
Alkaline Phosphatase: 74 U/L (ref 39–117)
BUN: 15 mg/dL (ref 6–23)
CO2: 32 mEq/L (ref 19–32)
Calcium: 8.9 mg/dL (ref 8.4–10.5)
Chloride: 102 mEq/L (ref 96–112)
Creatinine, Ser: 1.09 mg/dL (ref 0.40–1.20)
GFR: 60.95 mL/min (ref >60.00)
Glucose, Bld: 89 mg/dL (ref 70–99)
Potassium: 3.8 mEq/L (ref 3.5–5.1)
Sodium: 139 mEq/L (ref 135–145)
Total Bilirubin: 0.5 mg/dL (ref 0.2–1.2)
Total Protein: 7.3 g/dL (ref 6.0–8.3)

## 2021-07-08 LAB — VITAMIN D 25 HYDROXY (VIT D DEFICIENCY, FRACTURES): VITD: 59.62 ng/mL (ref 30.00–100.00)

## 2021-07-08 LAB — VITAMIN B12: Vitamin B-12: 449 pg/mL (ref 211–911)

## 2021-07-09 LAB — URINALYSIS, ROUTINE W REFLEX MICROSCOPIC
Bacteria, UA: NONE SEEN /HPF
Bilirubin Urine: NEGATIVE
Hyaline Cast: NONE SEEN /LPF
Ketones, ur: NEGATIVE
Leukocytes,Ua: NEGATIVE
Nitrite: NEGATIVE
Protein, ur: NEGATIVE
Specific Gravity, Urine: 1.019 (ref 1.001–1.035)
pH: 5.5 (ref 5.0–8.0)

## 2021-07-09 LAB — MICROSCOPIC MESSAGE

## 2021-07-14 ENCOUNTER — Ambulatory Visit (INDEPENDENT_AMBULATORY_CARE_PROVIDER_SITE_OTHER): Payer: BC Managed Care – PPO | Admitting: *Deleted

## 2021-07-14 ENCOUNTER — Other Ambulatory Visit: Payer: Self-pay

## 2021-07-14 DIAGNOSIS — E538 Deficiency of other specified B group vitamins: Secondary | ICD-10-CM | POA: Diagnosis not present

## 2021-07-14 MED ORDER — CYANOCOBALAMIN 1000 MCG/ML IJ SOLN
1000.0000 ug | Freq: Once | INTRAMUSCULAR | Status: AC
  Administered 2021-07-14: 1000 ug via INTRAMUSCULAR

## 2021-07-14 NOTE — Progress Notes (Signed)
Pt arrived for B12 injection. Pt supplied medication, given in L deltoid. Pt tolerated injection well, showed no signs of distress nor voiced any concerns.   Pt was also taught how to properly give B12 injection in deltoid on her own. Pt was successful on her own and demonstrated skills proficiently.

## 2021-07-28 ENCOUNTER — Other Ambulatory Visit: Payer: Self-pay

## 2021-07-28 ENCOUNTER — Ambulatory Visit (INDEPENDENT_AMBULATORY_CARE_PROVIDER_SITE_OTHER): Payer: BC Managed Care – PPO | Admitting: *Deleted

## 2021-07-28 DIAGNOSIS — E538 Deficiency of other specified B group vitamins: Secondary | ICD-10-CM

## 2021-07-28 MED ORDER — CYANOCOBALAMIN 1000 MCG/ML IJ SOLN
1000.0000 ug | Freq: Once | INTRAMUSCULAR | Status: AC
  Administered 2021-07-28: 1000 ug via INTRAMUSCULAR

## 2021-07-28 NOTE — Progress Notes (Signed)
Pt arrived for B12 injection. Pt supplied medication, given in R deltoid. Pt tolerated injection well, showed no signs of distress nor voiced any concerns.  ? ?Pt was instructed on how to give B12 injection properly on her own. Pt was successful and demonstrated skills proficiently. ?

## 2021-08-18 DIAGNOSIS — O903 Peripartum cardiomyopathy: Secondary | ICD-10-CM | POA: Diagnosis not present

## 2021-08-18 DIAGNOSIS — I10 Essential (primary) hypertension: Secondary | ICD-10-CM | POA: Diagnosis not present

## 2021-08-18 DIAGNOSIS — R0609 Other forms of dyspnea: Secondary | ICD-10-CM | POA: Diagnosis not present

## 2021-08-18 DIAGNOSIS — I5020 Unspecified systolic (congestive) heart failure: Secondary | ICD-10-CM | POA: Diagnosis not present

## 2021-08-24 DIAGNOSIS — L918 Other hypertrophic disorders of the skin: Secondary | ICD-10-CM | POA: Diagnosis not present

## 2021-08-24 DIAGNOSIS — D235 Other benign neoplasm of skin of trunk: Secondary | ICD-10-CM | POA: Diagnosis not present

## 2021-08-24 DIAGNOSIS — L732 Hidradenitis suppurativa: Secondary | ICD-10-CM | POA: Diagnosis not present

## 2021-08-24 DIAGNOSIS — L821 Other seborrheic keratosis: Secondary | ICD-10-CM | POA: Diagnosis not present

## 2021-09-18 ENCOUNTER — Ambulatory Visit
Admission: RE | Admit: 2021-09-18 | Discharge: 2021-09-18 | Disposition: A | Discharge status: Home / Self Care | Payer: BC Managed Care – PPO | Source: Ambulatory Visit | Attending: Internal Medicine | Admitting: Internal Medicine

## 2021-09-18 DIAGNOSIS — Z1231 Encounter for screening mammogram for malignant neoplasm of breast: Secondary | ICD-10-CM

## 2021-11-17 ENCOUNTER — Ambulatory Visit (INDEPENDENT_AMBULATORY_CARE_PROVIDER_SITE_OTHER): Payer: BC Managed Care – PPO | Admitting: Internal Medicine

## 2021-11-17 ENCOUNTER — Encounter: Payer: Self-pay | Admitting: Internal Medicine

## 2021-11-17 VITALS — BP 130/80 | HR 92 | Temp 98.2°F | Resp 14 | Ht 64.0 in | Wt 244.6 lb

## 2021-11-17 DIAGNOSIS — R109 Unspecified abdominal pain: Secondary | ICD-10-CM

## 2021-11-17 DIAGNOSIS — Z1211 Encounter for screening for malignant neoplasm of colon: Secondary | ICD-10-CM

## 2021-11-17 DIAGNOSIS — Z23 Encounter for immunization: Secondary | ICD-10-CM | POA: Diagnosis not present

## 2021-11-17 DIAGNOSIS — N3000 Acute cystitis without hematuria: Secondary | ICD-10-CM

## 2021-11-17 DIAGNOSIS — R319 Hematuria, unspecified: Secondary | ICD-10-CM

## 2021-11-18 LAB — URINE CULTURE
MICRO NUMBER:: 13577986
Result:: NO GROWTH
SPECIMEN QUALITY:: ADEQUATE

## 2021-11-18 LAB — URINALYSIS, ROUTINE W REFLEX MICROSCOPIC
Bacteria, UA: NONE SEEN /HPF
Bilirubin Urine: NEGATIVE
Hyaline Cast: NONE SEEN /LPF
Ketones, ur: NEGATIVE
Leukocytes,Ua: NEGATIVE
Nitrite: NEGATIVE
Specific Gravity, Urine: 1.018 (ref 1.001–1.035)
pH: 5.5 (ref 5.0–8.0)

## 2021-11-19 ENCOUNTER — Encounter: Payer: Self-pay | Admitting: Internal Medicine

## 2021-11-20 NOTE — Addendum Note (Signed)
Addended by: Orland Mustard on: 11/20/2021 12:46 PM   Modules accepted: Orders

## 2021-11-23 ENCOUNTER — Telehealth: Payer: Self-pay | Admitting: Internal Medicine

## 2021-11-23 NOTE — Telephone Encounter (Signed)
Lft pt vm to call ofc . thanks 

## 2021-12-04 ENCOUNTER — Ambulatory Visit
Admission: RE | Admit: 2021-12-04 | Discharge: 2021-12-04 | Disposition: A | Discharge status: Home / Self Care | Payer: BC Managed Care – PPO | Source: Ambulatory Visit | Attending: Internal Medicine | Admitting: Internal Medicine

## 2021-12-04 DIAGNOSIS — R109 Unspecified abdominal pain: Secondary | ICD-10-CM | POA: Insufficient documentation

## 2021-12-04 DIAGNOSIS — K769 Liver disease, unspecified: Secondary | ICD-10-CM | POA: Diagnosis not present

## 2021-12-04 DIAGNOSIS — R319 Hematuria, unspecified: Secondary | ICD-10-CM

## 2022-03-01 DIAGNOSIS — I10 Essential (primary) hypertension: Secondary | ICD-10-CM | POA: Diagnosis not present

## 2022-03-01 DIAGNOSIS — I5020 Unspecified systolic (congestive) heart failure: Secondary | ICD-10-CM | POA: Diagnosis not present

## 2022-03-01 DIAGNOSIS — O903 Peripartum cardiomyopathy: Secondary | ICD-10-CM | POA: Diagnosis not present

## 2022-03-03 ENCOUNTER — Ambulatory Visit (INDEPENDENT_AMBULATORY_CARE_PROVIDER_SITE_OTHER): Payer: BC Managed Care – PPO | Admitting: Internal Medicine

## 2022-03-03 ENCOUNTER — Encounter: Payer: Self-pay | Admitting: Internal Medicine

## 2022-03-03 VITALS — BP 126/72 | HR 77 | Temp 98.7°F | Ht 64.0 in | Wt 236.6 lb

## 2022-03-03 DIAGNOSIS — K602 Anal fissure, unspecified: Secondary | ICD-10-CM | POA: Diagnosis not present

## 2022-03-03 DIAGNOSIS — I5020 Unspecified systolic (congestive) heart failure: Secondary | ICD-10-CM

## 2022-03-03 DIAGNOSIS — Z1231 Encounter for screening mammogram for malignant neoplasm of breast: Secondary | ICD-10-CM | POA: Diagnosis not present

## 2022-03-03 DIAGNOSIS — R319 Hematuria, unspecified: Secondary | ICD-10-CM | POA: Diagnosis not present

## 2022-03-03 DIAGNOSIS — I10 Essential (primary) hypertension: Secondary | ICD-10-CM

## 2022-03-03 DIAGNOSIS — O903 Peripartum cardiomyopathy: Secondary | ICD-10-CM

## 2022-03-03 DIAGNOSIS — K625 Hemorrhage of anus and rectum: Secondary | ICD-10-CM

## 2022-03-03 DIAGNOSIS — T839XXD Unspecified complication of genitourinary prosthetic device, implant and graft, subsequent encounter: Secondary | ICD-10-CM

## 2022-03-03 HISTORY — DX: Unspecified systolic (congestive) heart failure: I50.20

## 2022-03-03 NOTE — Progress Notes (Signed)
Chief Complaint  Patient presents with   Follow-up    3-6 month f/u   Fu 1. Hematuria has low lying iud no stones last ct renal rec f/u ob/gyn to check iud placement she is not on cycle now but had been with blood in the urine before  2. Postpartum cardiomyopathy and HFrEF f/u [facility] Dr. Edwin Dada on entresto and Jardiance 10 entresto and BB and hctz per cards repeat echo 08/2022 3. Rectal bleeding c/w anal fissure needs referral to surgery prior to colonoscopy 04/30/22 [facility]    Review of Systems  Constitutional:  Negative for weight loss.  HENT:  Negative for hearing loss.   Eyes:  Negative for blurred vision.  Respiratory:  Negative for shortness of breath.   Cardiovascular:  Negative for chest pain.  Gastrointestinal:  Negative for abdominal pain and blood in stool.  Genitourinary:  Negative for dysuria.  Musculoskeletal:  Negative for falls and joint pain.  Skin:  Negative for rash.  Neurological:  Negative for headaches.  Psychiatric/Behavioral:  Negative for depression.    Past Medical History:  Diagnosis Date   Cardiomyopathy ([place])    Chickenpox    Fibroids    Focal nodular hyperplasia of liver    GERD (gastroesophageal reflux disease)    Hypertension    Prediabetes    Strep throat    UTI (lower urinary tract infection)    Past Surgical History:  Procedure Laterality Date   CHOLECYSTECTOMY  2003   Family History  Problem Relation Age of Onset   Heart disease Mother    Prostate cancer Father    Healthy Daughter    Uterine cancer Maternal Aunt    Breast cancer Cousin    Alcoholism Other        Uncle   Arthritis Other        Grandparent   Colon cancer Other        Great grandparent, great uncle   Stroke Other        Great grandparent   Hypertension Other        Parent   Bone cancer Other        Uncle   Social History   Socioeconomic History   Marital status: Married    Spouse name: Not on file   Number of children: Not on file   Years of education: Not  on file   Highest education level: Not on file  Occupational History   Not on file  Tobacco Use   Smoking status: Never   Smokeless tobacco: Never  Substance and Sexual Activity   Alcohol use: Yes    Alcohol/week: 0.0 standard drinks of alcohol    Comment: 1 a month, rare   Drug use: No   Sexual activity: Not on file  Other Topics Concern   Not on file  Social History Narrative   Lives with husband and 2 children in a one story home.     Works at call center.  Education: associates degree.   Husband as of 05/02/19 on dialysis    Social Determinants of Health   Financial Resource Strain: Not on file  Food Insecurity: Not on file  Transportation Needs: Not on file  Physical Activity: Not on file  Stress: Not on file  Social Connections: Not on file  Intimate Partner Violence: Not on file   Current Meds  Medication Sig   Ascorbic Acid (VITAMIN C PO) Take by mouth daily.   clindamycin (CLEOCIN T) 1 % lotion Apply  topically 2 (two) times daily. Bid under arms   cyanocobalamin (,VITAMIN B-12,) 1000 MCG/ML injection Inject 1 mL (1,000 mcg total) into the muscle every 30 (thirty) days.   empagliflozin (JARDIANCE) 10 MG TABS tablet Take 1 tablet by mouth daily.   ENTRESTO 49-51 MG SMARTSIG:1 By Mouth Twice Daily   Esomeprazole Magnesium (NEXIUM PO) Take by mouth as needed.   fexofenadine (ALLEGRA) 180 MG tablet Take 180 mg by mouth daily.   fluticasone (FLONASE) 50 MCG/ACT nasal spray Place 2 sprays into both nostrils daily.   hydrochlorothiazide (HYDRODIURIL) 25 MG tablet Take 25 mg by mouth daily.   levonorgestrel (MIRENA) 20 MCG/24HR IUD 1 each by Intrauterine route once.   metoprolol succinate (TOPROL-XL) 25 MG 24 hr tablet Take 1 tablet (25 mg total) by mouth in the morning and at bedtime.   Multiple Vitamins-Minerals (ZINC PO) Take by mouth daily.   valACYclovir (VALTREX) 500 MG tablet Take by mouth.   Allergies  Allergen Reactions   Coreg [Carvedilol]     sob   Norvasc  [Amlodipine]     5 mg leg swelling    No results found for this or any previous visit (from the past 2160 hour(s)). Objective  Body mass index is 40.61 kg/m. Wt Readings from Last 3 Encounters:  03/03/22 236 lb 9.6 oz (107.3 kg)  11/17/21 244 lb 9.6 oz (110.9 kg)  05/12/21 240 lb 3.2 oz (109 kg)   Temp Readings from Last 3 Encounters:  03/03/22 98.7 F (37.1 C) (Oral)  11/17/21 98.2 F (36.8 C) (Oral)  05/12/21 (!) 97.5 F (36.4 C) (Temporal)   BP Readings from Last 3 Encounters:  03/03/22 126/72  11/17/21 130/80  05/12/21 122/80   Pulse Readings from Last 3 Encounters:  03/03/22 77  11/17/21 92  05/12/21 (!) 106    Physical Exam Vitals and nursing note reviewed.  Constitutional:      Appearance: Normal appearance. She is well-developed and well-groomed.  HENT:     Head: Normocephalic and atraumatic.  Eyes:     Conjunctiva/sclera: Conjunctivae normal.     Pupils: Pupils are equal, round, and reactive to light.  Cardiovascular:     Rate and Rhythm: Normal rate and regular rhythm.     Heart sounds: Normal heart sounds. No murmur heard. Pulmonary:     Effort: Pulmonary effort is normal.     Breath sounds: Normal breath sounds.  Abdominal:     General: Abdomen is flat. Bowel sounds are normal.     Tenderness: There is no abdominal tenderness.  Musculoskeletal:        General: No tenderness.  Skin:    General: Skin is warm and dry.  Neurological:     General: No focal deficit present.     Mental Status: She is alert and oriented to person, place, and time. Mental status is at baseline.     Cranial Nerves: Cranial nerves 2-12 are intact.     Motor: Motor function is intact.     Coordination: Coordination is intact.     Gait: Gait is intact.  Psychiatric:        Attention and Perception: Attention and perception normal.        Mood and Affect: Mood and affect normal.        Speech: Speech normal.        Behavior: Behavior normal. Behavior is cooperative.         Thought Content: Thought content normal.  Cognition and Memory: Cognition and memory normal.        Judgment: Judgment normal.     Assessment  Plan  Rectal bleeding - Plan: Ambulatory referral to General Surgery  Anal fissure - Plan: Ambulatory referral to General Surgery  Hematuria, unspecified type - Plan: Urinalysis, Routine w reflex microscopic, Urine Culture  Postpartum cardiomyopathy Primary hypertension controlled HFrEF (heart failure with reduced ejection fraction) ([place])  f/u [facility] Dr. Edwin Dada on entresto and Jardiance 10 entresto and BB and hctz per cards repeat echo 08/2022  HM Declines flu shot  Tdap 11/17/21  Consider twinrix disc prev  mmr immune  covid 2/2 disc booster declines     OB/GYN appt sch for f/u ovarian cyst, fibroids now with BV as of 06/14/2018 [facility] -pap 06/02/17 neg pap neg HPV  Dr. Leonides Schanz labs had 07/08/20 pap neg pap neg HPV   mammo  09/18/21 negative  Dr. Leonides Schanz ordered    Colonoscopy disc age 40 est [facility] TBD 04/30/2022 needs to see surgery 1st rectal bleeding ? fissure  Reviewed labs 03/01/22   Vit D 06/16/17 15.2 on 50K now off rec D3 4000 IU qd  Neg HIV 06/14/2018,  CMET, lipid, TSH, B12, vitamin D, GC/G from 06/14/2018 A1C 5.7 06/14/2018     CT chest 06/20/2017 1.9 cm liver lesion 0.2 cm right lower lobe pulm nodule consider repeat CT chest in 1 year  CT chest 08/18/18 likely postinfectious benign nodules no change   MRI had ab/pelvis 08/03/19 repeat 07/2020    Of note B12 06/14/18 212, vitamin D 06/14/18 35.9 with h/o low vitmain D and B12    Echo repeat due 07/2021 Dr. Edwin Dada 01/2021 EF 35% Repeat echo 08/18/21  INTERPRETATION ---------------------------------------------------------------    MODERATE LV DYSFUNCTION (See above)    NORMAL RIGHT VENTRICULAR SYSTOLIC FUNCTION    VALVULAR REGURGITATION: TRIVIAL MR, TRIVIAL PR, TRIVIAL TR    NO VALVULAR STENOSIS  Repeat echo 09/01/22  Provider: Dr. Olivia Mackie McLean-Scocuzza-Internal  Medicine

## 2022-03-03 NOTE — Patient Instructions (Addendum)
Dr. Lysle Pearl   Phone Fax E-mail Address  [phone] Not available [place] [address]     Specialties     Surgery            [facility] [place] Dr. Jinny Blossom [place]   Sadie Haber Dr. Christophe Louis in [place] ob/gyn  Dr. Servando Salina in [facility] ob/gyn Dr. Charlesetta Garibaldi, Dr. Mancel Bale In [place]   Dr. Marcelline Mates (locally)  Anal Fissure, Adult  An anal fissure is a small tear or crack in the tissue of the anus. Bleeding from a fissure usually stops on its own within a few minutes. However, bleeding will often occur again with each bowel movement until the fissure heals. What are the causes? This condition is usually caused by passing a large or hard stool (feces). Other causes include: Constipation. Frequent diarrhea. Inflammatory bowel disease (Crohn's disease or ulcerative colitis). Childbirth. Infections. Anal sex. What are the signs or symptoms? Symptoms of this condition include: Bleeding from the rectum. Small amounts of blood seen on your stool, on the toilet paper, or in the toilet after a bowel movement. The blood coats the outside of the stool and is not mixed with the stool. Painful bowel movements. Itching or irritation around the anus. How is this diagnosed? A health care provider may diagnose this condition by closely examining the anal area. An anal fissure can usually be seen with careful inspection. In some cases, a rectal exam may be performed, or a short tube (anoscope) may be used to examine the anal canal. How is this treated? Initial treatment for this condition may include: Taking steps to avoid constipation. This may include making changes to your diet, such as increasing your intake of fiber or fluid. Taking fiber supplements. These supplements can soften your stool to help make bowel movements easier. Your health care provider may also prescribe a stool softener if your stool is hard. Taking  sitz baths. This may help to heal the tear. Using medicated creams or ointments. These may be prescribed to lessen discomfort. Treatments that are sometimes used if initial treatments do not work well or if the condition is more severe may include: Botulinum injection. Surgery to repair the fissure. Follow these instructions at home: Eating and drinking  Avoid foods that may cause constipation, such as bananas, milk, and other dairy products. Eat all fruits, except bananas. Drink enough fluid to keep your urine pale yellow. Eat foods that are high in fiber, such as beans, whole grains, and fresh fruits and vegetables. General instructions  Take over-the-counter and prescription medicines only as told by your health care provider. Use creams or ointments only as told by your health care provider. Keep the anal area clean and dry. Take sitz baths as told by your health care provider. Do not use soap in the sitz baths. Keep all follow-up visits as told by your health care provider. This is important. Contact a health care provider if you have: More bleeding. A fever. Diarrhea that is mixed with blood. Pain that continues. Ongoing problems that are getting worse rather than better. Summary An anal fissure is a small tear or crack in the tissue of the anus. This condition is usually caused by passing a large or hard stool (feces). Other causes include constipation and frequent diarrhea. Initial treatment for this condition may include taking steps to avoid constipation, such as increasing your intake of fiber or fluid. Follow instructions for care as told  by your health care provider. Contact your health care provider if you have more bleeding or your problem is getting worse rather than better. Keep all follow-up visits as told by your health care provider. This is important. This information is not intended to replace advice given to you by your health care provider. Make sure you discuss  any questions you have with your health care provider. Document Revised: 12/04/2020 Document Reviewed: 12/04/2020 Elsevier Patient Education  [place].

## 2022-03-04 LAB — URINALYSIS, ROUTINE W REFLEX MICROSCOPIC
Bacteria, UA: NONE SEEN /HPF
Bilirubin Urine: NEGATIVE
Hyaline Cast: NONE SEEN /LPF
Ketones, ur: NEGATIVE
Leukocytes,Ua: NEGATIVE
Nitrite: NEGATIVE
Protein, ur: NEGATIVE
Specific Gravity, Urine: 1.023 (ref 1.001–1.035)
pH: 5.5 (ref 5.0–8.0)

## 2022-03-04 LAB — URINE CULTURE
MICRO NUMBER:: 14037384
SPECIMEN QUALITY:: ADEQUATE

## 2022-03-04 LAB — MICROSCOPIC MESSAGE

## 2022-03-05 ENCOUNTER — Telehealth: Payer: Self-pay

## 2022-03-05 NOTE — Telephone Encounter (Signed)
2nd attempt at trying to contact pt in regards to labs did not leave a 2nd msg.

## 2022-03-05 NOTE — Telephone Encounter (Signed)
Patient states she is returning call from Gracy Racer, [place].  I transferred call to [facility].

## 2022-03-05 NOTE — Telephone Encounter (Signed)
LMOM for pt to CB in regards to labs 

## 2022-03-05 NOTE — Addendum Note (Signed)
Addended by: Orland Mustard on: 03/05/2022 04:06 PM   Modules accepted: Orders

## 2022-04-22 DIAGNOSIS — I5020 Unspecified systolic (congestive) heart failure: Secondary | ICD-10-CM | POA: Diagnosis not present

## 2022-04-22 DIAGNOSIS — I10 Essential (primary) hypertension: Secondary | ICD-10-CM | POA: Diagnosis not present

## 2022-07-12 ENCOUNTER — Encounter: Payer: Self-pay | Admitting: *Deleted

## 2022-07-29 NOTE — H&P (Signed)
Pre-Procedure H&P   Patient ID: Stacie Gill is a 47 y.o. female.  Gastroenterology Provider: Annamaria Helling, DO  Referring Provider: Dr. Terese Door PCP: Pccm, Ander Gaster, MD  Date: 07/30/2022  HPI Stacie Gill is a 47 y.o. female who presents today for Colonoscopy for colorectal cancer screening  Hemoglobin 12.2 platelets 267,000 MCV 86.6 creatinine 1.1  Last underwent colonoscopy approximately 15 years ago.  No family history colon cancer or colon polyps.  Patient reports 3-4 bowel movements daily.  At times she has constipation and with straining will have a tearing sensation and note bright red blood per rectum consistent with history of a fissure. She has changed her dietary habits and improved her bowel frequency and quality. Still notes irritation with defecation and some blood at times.  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  She has received clearance for NSS procedures from her cardiologist per records.  Status post cholecystectomy   Past Medical History:  Diagnosis Date   Cardiomyopathy ([place])    Chickenpox    Fibroids    Focal nodular hyperplasia of liver    GERD (gastroesophageal reflux disease)    Hypertension    Prediabetes    Strep throat    UTI (lower urinary tract infection)     Past Surgical History:  Procedure Laterality Date   CHOLECYSTECTOMY  2003    Family History No h/o GI disease or malignancy  Review of Systems  Constitutional:  Negative for activity change, appetite change, chills, diaphoresis, fatigue, fever and unexpected weight change.  HENT:  Negative for trouble swallowing and voice change.   Respiratory:  Negative for shortness of breath and wheezing.   Cardiovascular:  Negative for chest pain, palpitations and leg swelling.  Gastrointestinal:  Positive for anal bleeding, blood in stool and constipation. Negative for abdominal distention, abdominal pain, diarrhea, nausea, rectal pain  and vomiting.  Musculoskeletal:  Negative for arthralgias and myalgias.  Skin:  Negative for color change and pallor.  Neurological:  Negative for dizziness, syncope and weakness.  Psychiatric/Behavioral:  Negative for confusion.   All other systems reviewed and are negative.    Medications No current facility-administered medications on file prior to encounter.   Current Outpatient Medications on File Prior to Encounter  Medication Sig Dispense Refill   Ascorbic Acid (VITAMIN C PO) Take by mouth daily.     empagliflozin (JARDIANCE) 10 MG TABS tablet Take 1 tablet by mouth daily.     ENTRESTO 49-51 MG SMARTSIG:1 By Mouth Twice Daily     Esomeprazole Magnesium (NEXIUM PO) Take by mouth as needed.     hydrochlorothiazide (HYDRODIURIL) 25 MG tablet Take 25 mg by mouth daily.     metoprolol succinate (TOPROL-XL) 25 MG 24 hr tablet Take 1 tablet (25 mg total) by mouth in the morning and at bedtime. 180 tablet 3   Multiple Vitamins-Minerals (ZINC PO) Take by mouth daily.     clindamycin (CLEOCIN T) 1 % lotion Apply topically 2 (two) times daily. Bid under arms 60 mL 11   cyanocobalamin (,VITAMIN B-12,) 1000 MCG/ML injection Inject 1 mL (1,000 mcg total) into the muscle every 30 (thirty) days. (Patient not taking: Reported on 07/30/2022) 1 mL 0   fexofenadine (ALLEGRA) 180 MG tablet Take 180 mg by mouth daily.     fluticasone (FLONASE) 50 MCG/ACT nasal spray Place 2 sprays into both nostrils daily. 16 g 0   levonorgestrel (MIRENA) 20 MCG/24HR IUD 1 each by Intrauterine route once.  valACYclovir (VALTREX) 500 MG tablet Take by mouth.      Pertinent medications related to GI and procedure were reviewed by me with the patient prior to the procedure   Current Facility-Administered Medications:    0.9 %  sodium chloride infusion, , Intravenous, Continuous, Annamaria Helling, DO      Allergies  Allergen Reactions   Coreg [Carvedilol]     sob   Norvasc [Amlodipine]     5 mg leg  swelling    Allergies were reviewed by me prior to the procedure  Objective   Body mass index is 40.06 kg/m. Vitals:   07/30/22 0706  BP: 110/78  Pulse: 99  Resp: 20  Temp: (!) 96.7 F (35.9 C)  TempSrc: Temporal  SpO2: 100%  Weight: 105.9 kg  Height: '5\' 4"'$  (1.626 m)     Physical Exam Vitals and nursing note reviewed.  Constitutional:      General: She is not in acute distress.    Appearance: Normal appearance. She is obese. She is not ill-appearing, toxic-appearing or diaphoretic.  HENT:     Head: Normocephalic and atraumatic.     Nose: Nose normal.     Mouth/Throat:     Mouth: Mucous membranes are moist.     Pharynx: Oropharynx is clear.  Eyes:     General: No scleral icterus.    Extraocular Movements: Extraocular movements intact.  Cardiovascular:     Rate and Rhythm: Normal rate and regular rhythm.     Heart sounds: Normal heart sounds. No murmur heard.    No friction rub. No gallop.  Pulmonary:     Effort: Pulmonary effort is normal. No respiratory distress.     Breath sounds: Normal breath sounds. No wheezing, rhonchi or rales.  Abdominal:     General: Bowel sounds are normal. There is no distension.     Palpations: Abdomen is soft.     Tenderness: There is no abdominal tenderness. There is no guarding or rebound.  Musculoskeletal:     Cervical back: Neck supple.     Right lower leg: No edema.     Left lower leg: No edema.  Skin:    General: Skin is warm and dry.     Coloration: Skin is not jaundiced or pale.  Neurological:     General: No focal deficit present.     Mental Status: She is alert and oriented to person, place, and time. Mental status is at baseline.  Psychiatric:        Mood and Affect: Mood normal.        Behavior: Behavior normal.        Thought Content: Thought content normal.        Judgment: Judgment normal.      Assessment:  Stacie Gill is a 47 y.o. female  who presents today for Colonoscopy for colorectal  cancer screening.  Plan:  Colonoscopy with possible intervention today  Colonoscopy with possible biopsy, control of bleeding, polypectomy, and interventions as necessary has been discussed with the patient/patient representative. Informed consent was obtained from the patient/patient representative after explaining the indication, nature, and risks of the procedure including but not limited to death, bleeding, perforation, missed neoplasm/lesions, cardiorespiratory compromise, and reaction to medications. Opportunity for questions was given and appropriate answers were provided. Patient/patient representative has verbalized understanding is amenable to undergoing the procedure.   Annamaria Helling, DO  [facility]  Portions of the record may have been created with voice  recognition software. Occasional wrong-word or 'sound-a-like' substitutions may have occurred due to the inherent limitations of voice recognition software.  Read the chart carefully and recognize, using context, where substitutions may have occurred.

## 2022-07-30 ENCOUNTER — Ambulatory Visit: Payer: BC Managed Care – PPO | Admitting: Anesthesiology

## 2022-07-30 ENCOUNTER — Encounter: Payer: Self-pay | Admitting: Gastroenterology

## 2022-07-30 ENCOUNTER — Encounter
Admission: RE | Disposition: A | Discharge status: Home / Self Care | Payer: Self-pay | Source: Ambulatory Visit | Attending: Gastroenterology

## 2022-07-30 ENCOUNTER — Ambulatory Visit
Admission: RE | Admit: 2022-07-30 | Discharge: 2022-07-30 | Disposition: A | Discharge status: Home / Self Care | Payer: BC Managed Care – PPO | Source: Ambulatory Visit | Attending: Gastroenterology | Admitting: Gastroenterology

## 2022-07-30 ENCOUNTER — Other Ambulatory Visit: Payer: Self-pay

## 2022-07-30 DIAGNOSIS — I110 Hypertensive heart disease with heart failure: Secondary | ICD-10-CM | POA: Insufficient documentation

## 2022-07-30 DIAGNOSIS — K649 Unspecified hemorrhoids: Secondary | ICD-10-CM | POA: Diagnosis not present

## 2022-07-30 DIAGNOSIS — K635 Polyp of colon: Secondary | ICD-10-CM | POA: Diagnosis not present

## 2022-07-30 DIAGNOSIS — Z1211 Encounter for screening for malignant neoplasm of colon: Secondary | ICD-10-CM | POA: Diagnosis not present

## 2022-07-30 DIAGNOSIS — R7303 Prediabetes: Secondary | ICD-10-CM | POA: Insufficient documentation

## 2022-07-30 DIAGNOSIS — K5730 Diverticulosis of large intestine without perforation or abscess without bleeding: Secondary | ICD-10-CM | POA: Diagnosis not present

## 2022-07-30 DIAGNOSIS — I5020 Unspecified systolic (congestive) heart failure: Secondary | ICD-10-CM | POA: Insufficient documentation

## 2022-07-30 DIAGNOSIS — F419 Anxiety disorder, unspecified: Secondary | ICD-10-CM | POA: Insufficient documentation

## 2022-07-30 DIAGNOSIS — D125 Benign neoplasm of sigmoid colon: Secondary | ICD-10-CM | POA: Diagnosis not present

## 2022-07-30 DIAGNOSIS — Z7984 Long term (current) use of oral hypoglycemic drugs: Secondary | ICD-10-CM | POA: Insufficient documentation

## 2022-07-30 DIAGNOSIS — K640 First degree hemorrhoids: Secondary | ICD-10-CM | POA: Insufficient documentation

## 2022-07-30 DIAGNOSIS — Z6841 Body Mass Index (BMI) 40.0 and over, adult: Secondary | ICD-10-CM | POA: Insufficient documentation

## 2022-07-30 DIAGNOSIS — I429 Cardiomyopathy, unspecified: Secondary | ICD-10-CM | POA: Insufficient documentation

## 2022-07-30 HISTORY — PX: COLONOSCOPY WITH PROPOFOL: SHX5780

## 2022-07-30 SURGERY — COLONOSCOPY WITH PROPOFOL
Anesthesia: General

## 2022-07-30 MED ORDER — SODIUM CHLORIDE 0.9 % IV SOLN: INTRAVENOUS | Status: DC

## 2022-07-30 MED ORDER — PROPOFOL 10 MG/ML IV BOLUS
INTRAVENOUS | Status: DC | PRN
  Administered 2022-07-30: 80 mg via INTRAVENOUS

## 2022-07-30 MED ORDER — LIDOCAINE HCL (CARDIAC) PF 100 MG/5ML IV SOSY
PREFILLED_SYRINGE | INTRAVENOUS | Status: DC | PRN
  Administered 2022-07-30: 50 mg via INTRAVENOUS

## 2022-07-30 MED ORDER — PHENYLEPHRINE HCL (PRESSORS) 10 MG/ML IV SOLN
INTRAVENOUS | Status: DC | PRN
  Administered 2022-07-30 (×2): 80 ug via INTRAVENOUS
  Administered 2022-07-30 (×2): 160 ug via INTRAVENOUS
  Administered 2022-07-30: 80 ug via INTRAVENOUS

## 2022-07-30 MED ORDER — PROPOFOL 500 MG/50ML IV EMUL
INTRAVENOUS | Status: DC | PRN
  Administered 2022-07-30: 150 ug/kg/min via INTRAVENOUS

## 2022-07-30 NOTE — Transfer of Care (Signed)
Immediate Anesthesia Transfer of Care Note  Patient: Stacie Gill  Procedure(s) Performed: COLONOSCOPY WITH PROPOFOL  Patient Location: Endoscopy Unit  Anesthesia Type:General  Level of Consciousness: awake sedated  Airway & Oxygen Therapy: Patient Spontanous Breathing  Post-op Assessment: Report given to RN and Post -op Vital signs reviewed and stable  Post vital signs: Reviewed and stable  Last Vitals:  Vitals Value Taken Time  BP 98/50 07/30/22 0800  Temp    Pulse 80 07/30/22 0800  Resp 22 07/30/22 0800  SpO2 100 % 07/30/22 0800  Vitals shown include unvalidated device data.  Last Pain:  Vitals:   07/30/22 0706  TempSrc: Temporal  PainSc: 0-No pain         Complications: No notable events documented.

## 2022-07-30 NOTE — Anesthesia Postprocedure Evaluation (Signed)
Anesthesia Post Note  Patient: Stacie Gill  Procedure(s) Performed: COLONOSCOPY WITH PROPOFOL  Patient location during evaluation: PACU Anesthesia Type: General Level of consciousness: awake and alert Pain management: pain level controlled Vital Signs Assessment: post-procedure vital signs reviewed and stable Respiratory status: spontaneous breathing, nonlabored ventilation, respiratory function stable and patient connected to nasal cannula oxygen Cardiovascular status: blood pressure returned to baseline and stable Postop Assessment: no apparent nausea or vomiting Anesthetic complications: no   No notable events documented.   Last Vitals:  Vitals:   07/30/22 0759 07/30/22 0804  BP: (!) 98/50 (!) 101/52  Pulse: 80   Resp: 16   Temp:  (!) 35.6 C  SpO2: 99%     Last Pain:  Vitals:   07/30/22 0804  TempSrc: Temporal  PainSc: 0-No pain                 Arita Miss

## 2022-07-30 NOTE — Op Note (Signed)
[facility]astroenter[facility] Patient Name: Michol Can Procedure Date: 07/30/2022 7:14 AM MRN: [id number] Account #: [id number] Date of Birth: 1975-07-22 Admit Type: Outpatient Age: 47 Room: [facility] ENDO ROOM 3 Gender: Female Note Status: Supervisor Override Instrument Name: Jasper Riling [id number] Procedure:             Colonoscopy Indications:           Screening for colorectal malignant neoplasm Providers:             Annamaria Helling DO, DO Referring MD:          No Local Md, MD (Referring MD) Medicines:             Monitored Anesthesia Care Complications:         No immediate complications. Estimated blood loss:                         Minimal. Procedure:             Pre-Anesthesia Assessment:                        - Prior to the procedure, a History and Physical was                         performed, and patient medications and allergies were                         reviewed. The patient is competent. The risks and                         benefits of the procedure and the sedation options and                         risks were discussed with the patient. All questions                         were answered and informed consent was obtained.                         Patient identification and proposed procedure were                         verified by the physician, the nurse, the anesthetist                         and the technician in the endoscopy suite. Mental                         Status Examination: alert and oriented. Airway                         Examination: normal oropharyngeal airway and neck                         mobility. Respiratory Examination: clear to                         auscultation. CV Examination: RRR, no murmurs, no S3  or S4. Prophylactic Antibiotics: The patient does not                         require prophylactic antibiotics. Prior                         Anticoagulants: The patient has taken no  anticoagulant                         or antiplatelet agents. ASA Grade Assessment: III - A                         patient with severe systemic disease. After reviewing                         the risks and benefits, the patient was deemed in                         satisfactory condition to undergo the procedure. The                         anesthesia plan was to use monitored anesthesia care                         (MAC). Immediately prior to administration of                         medications, the patient was re-assessed for adequacy                         to receive sedatives. The heart rate, respiratory                         rate, oxygen saturations, blood pressure, adequacy of                         pulmonary ventilation, and response to care were                         monitored throughout the procedure. The physical                         status of the patient was re-assessed after the                         procedure.                        After obtaining informed consent, the colonoscope was                         passed under direct vision. Throughout the procedure,                         the patient's blood pressure, pulse, and oxygen                         saturations were monitored continuously. The  Colonoscope was introduced through the anus and                         advanced to the the terminal ileum, with                         identification of the appendiceal orifice and IC                         valve. The colonoscopy was performed without                         difficulty. The patient tolerated the procedure well.                         The quality of the bowel preparation was evaluated                         using the BBPS [facility] Bowel Preparation Scale) with                         scores of: Right Colon = 2 (minor amount of residual                         staining, small fragments of stool and/or opaque                          liquid, but mucosa seen well), Transverse Colon = 3                         (entire mucosa seen well with no residual staining,                         small fragments of stool or opaque liquid) and Left                         Colon = 2 (minor amount of residual staining, small                         fragments of stool and/or opaque liquid, but mucosa                         seen well). The total BBPS score equals 7. The quality                         of the bowel preparation was good. The terminal ileum,                         ileocecal valve, appendiceal orifice, and rectum were                         photographed. Findings:      The perianal and digital rectal examinations were normal. Pertinent       negatives include normal sphincter tone.      The terminal ileum appeared normal. Estimated blood loss: none.      Retroflexion in the right colon was performed.  Two sessile polyps were found in the sigmoid colon. The polyps were 1 to       2 mm in size. These polyps were removed with a jumbo cold forceps.       Resection and retrieval were complete. Estimated blood loss was minimal.      Multiple small-mouthed diverticula were found in the left colon.       Estimated blood loss: none.      Non-bleeding internal hemorrhoids were found during retroflexion. The       hemorrhoids were Grade I (internal hemorrhoids that do not prolapse).       Estimated blood loss: none.      The exam was otherwise without abnormality on direct and retroflexion       views. Impression:            - The examined portion of the ileum was normal.                        - Two 1 to 2 mm polyps in the sigmoid colon, removed                         with a jumbo cold forceps. Resected and retrieved.                        - Diverticulosis in the left colon.                        - Non-bleeding internal hemorrhoids.                        - The examination was otherwise normal on direct and                          retroflexion views. Recommendation:        - Patient has a contact number available for                         emergencies. The signs and symptoms of potential                         delayed complications were discussed with the patient.                         Return to normal activities tomorrow. Written                         discharge instructions were provided to the patient.                        - Discharge patient to home.                        - Resume previous diet.                        - Continue present medications.                        - Await pathology results.                        -  Repeat colonoscopy for surveillance based on                         pathology results.                        - Return to referring physician as previously                         scheduled.                        - The findings and recommendations were discussed with                         the patient. Procedure Code(s):     --- Professional ---                        [phone], Colonoscopy, flexible; with biopsy, single or                         multiple Diagnosis Code(s):     --- Professional ---                        Z12.11, Encounter for screening for malignant neoplasm                         of colon                        K64.0, First degree hemorrhoids                        D12.5, Benign neoplasm of sigmoid colon                        K57.30, Diverticulosis of large intestine without                         perforation or abscess without bleeding CPT copyright 2022 American Medical Association. All rights reserved. The codes documented in this report are preliminary and upon coder review may  be revised to meet current compliance requirements. Attending Participation:      I personally performed the entire procedure. Volney American, DO Annamaria Helling DO, DO 07/30/2022 7:58:27 AM This report has been signed electronically. Number of Addenda: 0 Note  Initiated On: 07/30/2022 7:14 AM Scope Withdrawal Time: 0 hours 15 minutes 39 seconds  Total Procedure Duration: 0 hours 19 minutes 4 seconds  Estimated Blood Loss:  Estimated blood loss was minimal.      [facility]

## 2022-07-30 NOTE — Interval H&P Note (Signed)
History and Physical Interval Note: Preprocedure H&P from 07/30/22  was reviewed and there was no interval change after seeing and examining the patient.  Written consent was obtained from the patient after discussion of risks, benefits, and alternatives. Patient has consented to proceed with Colonoscopy with possible intervention   07/30/2022 7:21 AM  Stacie Gill  has presented today for surgery, with the diagnosis of Z12.11 - Colon cancer screening.  The various methods of treatment have been discussed with the patient and family. After consideration of risks, benefits and other options for treatment, the patient has consented to  Procedure(s): COLONOSCOPY WITH PROPOFOL (N/A) as a surgical intervention.  The patient's history has been reviewed, patient examined, no change in status, stable for surgery.  I have reviewed the patient's chart and labs.  Questions were answered to the patient's satisfaction.     Annamaria Helling

## 2022-07-30 NOTE — Anesthesia Procedure Notes (Signed)
Date/Time: 07/30/2022 7:30 AM  Performed by: Johnna Acosta, CRNAPre-anesthesia Checklist: Patient identified, Emergency Drugs available, Suction available, Patient being monitored and Timeout performed Patient Re-evaluated:Patient Re-evaluated prior to induction Oxygen Delivery Method: Nasal cannula Preoxygenation: Pre-oxygenation with 100% oxygen Induction Type: IV induction

## 2022-07-30 NOTE — Anesthesia Preprocedure Evaluation (Addendum)
Anesthesia Evaluation  Patient identified by MRN, date of birth, ID band Patient awake    Reviewed: Allergy & Precautions, NPO status , Patient's Chart, lab work & pertinent test results  History of Anesthesia Complications Negative for: history of anesthetic complications  Airway Mallampati: II  TM Distance: >3 FB Neck ROM: Full    Dental no notable dental hx. (+) Teeth Intact   Pulmonary neg pulmonary ROS, neg sleep apnea, neg COPD, Patient abstained from smoking.Not current smoker   Pulmonary exam normal breath sounds clear to auscultation       Cardiovascular Exercise Tolerance: Good METShypertension, Pt. on medications +CHF  (-) CAD and (-) Past MI (-) dysrhythmias  Rhythm:Regular Rate:Normal - Systolic murmurs EF [id number] on most recent TTE. 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   Neuro/Psych  PSYCHIATRIC DISORDERS Anxiety     negative neurological ROS     GI/Hepatic ,GERD  ,,(+)     (-) substance abuse    Endo/Other  neg diabetes  Morbid obesity  Renal/GU negative Renal ROS     Musculoskeletal   Abdominal  (+) + obese  Peds  Hematology   Anesthesia Other Findings Past Medical History: No date: Cardiomyopathy ([place]) No date: Chickenpox No date: Fibroids No date: Focal nodular hyperplasia of liver No date: GERD (gastroesophageal reflux disease) No date: Hypertension No date: Prediabetes No date: Strep throat No date: UTI (lower urinary tract infection)  Reproductive/Obstetrics                             Anesthesia Physical Anesthesia Plan  ASA: 3  Anesthesia Plan: General   Post-op Pain Management: Minimal or no pain anticipated   Induction: Intravenous  PONV Risk Score and Plan: 3 and Propofol infusion, TIVA and Ondansetron  Airway Management Planned: Nasal Cannula  Additional Equipment: None  Intra-op Plan:   Post-operative Plan:    Informed Consent: I have reviewed the patients History and Physical, chart, labs and discussed the procedure including the risks, benefits and alternatives for the proposed anesthesia with the patient or authorized representative who has indicated his/her understanding and acceptance.     Dental advisory given  Plan Discussed with: CRNA and Surgeon  Anesthesia Plan Comments: (Discussed risks of anesthesia with patient, including possibility of difficulty with spontaneous ventilation under anesthesia necessitating airway intervention, PONV, and rare risks such as cardiac or respiratory or neurological events, and allergic reactions. Discussed the role of CRNA in patient's perioperative care. Patient understands.)        Anesthesia Quick Evaluation

## 2022-07-31 NOTE — Progress Notes (Signed)
Non-identified Voicemail.  No Message Left. 

## 2022-08-02 ENCOUNTER — Encounter: Payer: Self-pay | Admitting: Gastroenterology

## 2022-08-02 LAB — SURGICAL PATHOLOGY

## 2022-08-04 ENCOUNTER — Encounter: Payer: Self-pay | Admitting: Obstetrics and Gynecology

## 2022-08-04 ENCOUNTER — Other Ambulatory Visit (HOSPITAL_COMMUNITY)
Admission: RE | Admit: 2022-08-04 | Discharge: 2022-08-04 | Disposition: A | Discharge status: Home / Self Care | Payer: BC Managed Care – PPO | Source: Ambulatory Visit | Attending: Obstetrics and Gynecology | Admitting: Obstetrics and Gynecology

## 2022-08-04 ENCOUNTER — Ambulatory Visit (INDEPENDENT_AMBULATORY_CARE_PROVIDER_SITE_OTHER): Payer: BC Managed Care – PPO | Admitting: Obstetrics and Gynecology

## 2022-08-04 VITALS — BP 107/67 | HR 84 | Ht 63.74 in | Wt 234.0 lb

## 2022-08-04 DIAGNOSIS — R319 Hematuria, unspecified: Secondary | ICD-10-CM | POA: Insufficient documentation

## 2022-08-04 DIAGNOSIS — T839XXS Unspecified complication of genitourinary prosthetic device, implant and graft, sequela: Secondary | ICD-10-CM

## 2022-08-04 DIAGNOSIS — R350 Frequency of micturition: Secondary | ICD-10-CM | POA: Diagnosis not present

## 2022-08-04 LAB — URINALYSIS, ROUTINE W REFLEX MICROSCOPIC
Bacteria, UA: NONE SEEN
Bilirubin Urine: NEGATIVE
Glucose, UA: >=500 mg/dL — AB
Ketones, ur: NEGATIVE mg/dL
Leukocytes,Ua: NEGATIVE
Nitrite: NEGATIVE
Protein, ur: 30 mg/dL — AB
Specific Gravity, Urine: 1.018 (ref 1.005–1.030)
pH: 5 (ref 5.0–8.0)

## 2022-08-04 LAB — POCT URINALYSIS DIPSTICK
Bilirubin, UA: NEGATIVE
Glucose, UA: POSITIVE — AB
Ketones, UA: NEGATIVE
Leukocytes, UA: NEGATIVE
Nitrite, UA: NEGATIVE
Protein, UA: POSITIVE — AB
Spec Grav, UA: 1.025 (ref 1.010–1.025)
Urobilinogen, UA: 0.2 E.U./dL
pH, UA: 5.5 (ref 5.0–8.0)

## 2022-08-04 NOTE — Progress Notes (Unsigned)
[facility] New Patient Evaluation and Consultation  Referring Provider: McLean-Scocuzza, Olivia Mackie * PCP: Pccm, Ander Gaster, MD Date of Service: 08/04/2022  SUBJECTIVE Chief Complaint: New Patient (Initial Visit) (Stacie Gill is a 47 y.o. female here for a consult for hematuria.)  History of Present Illness: Stacie Gill is a 47 y.o. Black or African-American female seen in consultation at the request of Dr. Terese Door for evaluation of hematuria.    Review of records significant for: Had CT abdomen and pelvis without contrast on 12/04/21 which was negative for any stones. Reported low lying IUD extending to lower uterine segment.   Last micro UA on 03/03/22 showed 0-2 RBCs. UA on 07/08/21 showed 3-10 RBCs. No urine culture was performed at that time.   Urinary Symptoms: Does not leak urine.   Day time voids: 7-10.  Nocturia: 0 times per night to void. Voiding dysfunction: she empties her bladder well.  does not use a catheter to empty bladder.  When urinating, she feels she has no difficulties  UTIs:  0  UTI's in the last year.   Reports history of blood in urine. She does not see blood in the urine, but patient reports that there was microscopic blood. On 06/2021 when there was 3-10 RBC/ hpf she reported she had some vaginal spotting.  In July, she was having back pain and with the hematuria, the CT scan w/o contrast was ordered   Pelvic Organ Prolapse Symptoms:                  She Denies a feeling of a bulge the vaginal area.   Bowel Symptom: Bowel movements: 2 time(s) per day Stool consistency: soft  Straining: no.  Splinting: no.  Incomplete evacuation: no.  She Denies accidental bowel leakage / fecal incontinence Bowel regimen: none  Sexual Function Sexually active: no.  Sexual orientation:  heterosexual Pain with sex: No  Pelvic Pain Denies pelvic pain   Past Medical History:  Past Medical History:  Diagnosis Date    Cardiomyopathy ([place])    Chickenpox    Fibroids    Focal nodular hyperplasia of liver    GERD (gastroesophageal reflux disease)    Hypertension    Prediabetes    Strep throat    UTI (lower urinary tract infection)      Past Surgical History:   Past Surgical History:  Procedure Laterality Date   CHOLECYSTECTOMY  05/24/2001   CHOLECYSTECTOMY     COLONOSCOPY WITH PROPOFOL N/A 07/30/2022   Procedure: COLONOSCOPY WITH PROPOFOL;  Surgeon: Annamaria Helling, DO;  Location: [facility] ENDOSCOPY;  Service: Gastroenterology;  Laterality: N/A;     Past OB/GYN History: OB History  Gravida Para Term Preterm AB Living  '3 2 2   1 2  '$ SAB IAB Ectopic Multiple Live Births  1       2    # Outcome Date GA Lbr Len/2nd Weight Sex Delivery Anes PTL Lv  3 Term      Vag-Spont     2 Term      Vag-Spont     1 SAB             Vaginal deliveries: ***,  Forceps/ Vacuum deliveries: ***, Cesarean section: *** Has Mirena IUD- LMP 08/02/22 Last pap smear was 07/08/20.     Medications: She has a current medication list which includes the following prescription(s): ascorbic acid, clindamycin, cyanocobalamin, empagliflozin, entresto, esomeprazole magnesium, fexofenadine, fluticasone, hydrochlorothiazide, levonorgestrel, multiple vitamins-minerals, valacyclovir, and metoprolol succinate.  Allergies: Patient is allergic to coreg [carvedilol] and norvasc [amlodipine].   Social History:  Social History   Tobacco Use   Smoking status: Never   Smokeless tobacco: Never  Vaping Use   Vaping Use: Never used  Substance Use Topics   Alcohol use: Yes    Alcohol/week: 0.0 standard drinks of alcohol    Comment: 1 a month, rare   Drug use: No    Relationship status: married She lives with husband/ children.   She is employed as a Database administrator. Regular exercise: No History of abuse: No  Family History:   Family History  Problem Relation Age of Onset   Heart disease Mother    Prostate cancer Father     Healthy Daughter    Uterine cancer Maternal Aunt    Breast cancer Cousin    Alcoholism Other        Uncle   Arthritis Other        Grandparent   Colon cancer Other        Great grandparent, great uncle   Stroke Other        Great grandparent   Hypertension Other        Parent   Bone cancer Other        Uncle     Review of Systems: Review of Systems  Constitutional:  Positive for malaise/fatigue. Negative for fever and weight loss.  Respiratory:  Negative for cough, shortness of breath and wheezing.   Cardiovascular:  Negative for chest pain, palpitations and leg swelling.  Gastrointestinal:  Negative for abdominal pain and blood in stool.  Genitourinary:  Negative for dysuria.  Musculoskeletal:  Negative for myalgias.  Skin:  Negative for rash.  Neurological:  Negative for dizziness and headaches.  Endo/Heme/Allergies:  Does not bruise/bleed easily.  Psychiatric/Behavioral:  Negative for depression. The patient is not nervous/anxious.      OBJECTIVE Physical Exam: Vitals:   08/04/22 0843  BP: 107/67  Pulse: 84  Weight: 234 lb (106.1 kg)  Height: 5' 3.74" (1.619 m)    Physical Exam Constitutional:      General: She is not in acute distress.    Appearance: She is obese.  Pulmonary:     Effort: Pulmonary effort is normal.  Abdominal:     General: There is no distension.     Palpations: Abdomen is soft.     Tenderness: There is no abdominal tenderness. There is no rebound.  Musculoskeletal:        General: No swelling. Normal range of motion.  Skin:    General: Skin is warm and dry.     Findings: No rash.  Neurological:     Mental Status: She is alert and oriented to person, place, and time.  Psychiatric:        Mood and Affect: Mood normal.        Behavior: Behavior normal.      GU / Detailed Urogynecologic Evaluation:  Pelvic Exam: Normal external female genitalia; Bartholin's and Skene's glands normal in appearance; urethral meatus normal in  appearance, no urethral masses or discharge.   Urethra prepped with betadine and catheterized specimen of urine obtained.   CST: negative  Speculum exam reveals normal vaginal mucosa without atrophy. Blood present in vaginal vault. Cervix normal appearance, IUD strings visualized. Uterus normal single, nontender. Adnexa no mass, fullness, tenderness.    Pelvic floor strength I/V  Pelvic floor musculature: Right levator non-tender, Right obturator non-tender, Left levator non-tender, Left obturator non-tender  POP-Q:  Deferred, no significant prolapse noted.    Rectal Exam:  Normal external rectum  Post-Void Residual (PVR) by Bladder Scan: In order to evaluate bladder emptying, we discussed obtaining a postvoid residual and she agreed to this procedure.  Procedure: The ultrasound unit was placed on the patient's abdomen in the suprapubic region after the patient had voided. A PVR of 9 ml was obtained by bladder scan.  Laboratory Results: '@ENCLABS'$ @    ASSESSMENT AND PLAN Ms. Dux is a 47 y.o. with: No diagnosis found.  - cath urine specimen obtained today since she is menstruating- moderate blood present. Will send for micro UA and culture. We discussed possible need for renal US vs CT with contrast and cystoscopy if there is a significant amount of blood present.  - no further testing needed - TVUS for IUD placement. Trying to get appt at [place], [place]   Medical Decision Making:  - Reviewed/ ordered a clinical laboratory test - Reviewed/ ordered a radiologic study - Reviewed/ ordered medicine test - Decision to obtain old records - Discussion of management of or test interpretation with an external physician / other healthcare professional  - Assessment requiring independent historian - Review and summation of prior records - Independent review of image, tracing or specimen

## 2022-08-05 LAB — URINE CULTURE: Culture: NO GROWTH

## 2022-08-16 ENCOUNTER — Ambulatory Visit: Payer: BC Managed Care – PPO

## 2022-08-17 ENCOUNTER — Ambulatory Visit
Admission: RE | Admit: 2022-08-17 | Discharge: 2022-08-17 | Disposition: A | Discharge status: Home / Self Care | Payer: BC Managed Care – PPO | Source: Ambulatory Visit | Attending: Obstetrics and Gynecology | Admitting: Obstetrics and Gynecology

## 2022-08-17 DIAGNOSIS — D259 Leiomyoma of uterus, unspecified: Secondary | ICD-10-CM | POA: Insufficient documentation

## 2022-08-17 DIAGNOSIS — T839XXS Unspecified complication of genitourinary prosthetic device, implant and graft, sequela: Secondary | ICD-10-CM | POA: Insufficient documentation

## 2022-08-17 DIAGNOSIS — X58XXXA Exposure to other specified factors, initial encounter: Secondary | ICD-10-CM | POA: Insufficient documentation

## 2022-08-17 DIAGNOSIS — N852 Hypertrophy of uterus: Secondary | ICD-10-CM | POA: Insufficient documentation

## 2022-08-17 DIAGNOSIS — T839XXA Unspecified complication of genitourinary prosthetic device, implant and graft, initial encounter: Secondary | ICD-10-CM | POA: Diagnosis not present

## 2022-08-17 DIAGNOSIS — D250 Submucous leiomyoma of uterus: Secondary | ICD-10-CM | POA: Diagnosis not present

## 2022-08-20 ENCOUNTER — Other Ambulatory Visit (HOSPITAL_BASED_OUTPATIENT_CLINIC_OR_DEPARTMENT_OTHER): Payer: Self-pay | Admitting: Obstetrics & Gynecology

## 2022-08-27 DIAGNOSIS — L732 Hidradenitis suppurativa: Secondary | ICD-10-CM | POA: Diagnosis not present

## 2022-08-27 DIAGNOSIS — L2089 Other atopic dermatitis: Secondary | ICD-10-CM | POA: Diagnosis not present

## 2022-08-27 DIAGNOSIS — L821 Other seborrheic keratosis: Secondary | ICD-10-CM | POA: Diagnosis not present

## 2022-09-01 DIAGNOSIS — I5020 Unspecified systolic (congestive) heart failure: Secondary | ICD-10-CM | POA: Diagnosis not present

## 2022-09-01 DIAGNOSIS — I10 Essential (primary) hypertension: Secondary | ICD-10-CM | POA: Diagnosis not present

## 2022-09-01 DIAGNOSIS — O903 Peripartum cardiomyopathy: Secondary | ICD-10-CM | POA: Diagnosis not present

## 2022-09-02 ENCOUNTER — Telehealth (HOSPITAL_BASED_OUTPATIENT_CLINIC_OR_DEPARTMENT_OTHER): Payer: Self-pay | Admitting: *Deleted

## 2022-09-02 NOTE — Telephone Encounter (Signed)
-----   Message from Stacie Bears, MD sent at 08/26/2022  9:51 AM EDT ----- Regarding: FW: IUD patient This a pt that Marcelino Duster wants me to see.  Will Darlene do a 4:30 ultrasound appt.  We could do this on 4/17.  Pt needs IUD removal and would be helpful with u/s guidance.  She will be a new patient to [name].  Thanks.  MSM  ----- Message ----- From: Marguerita Beards, MD Sent: 08/18/2022   1:13 PM EDT To: Stacie Bears, MD Subject: IUD patient                                    Hi- this is the patient I messaged you about. She has an IUD and CT says "slightly low lying IUD". She said she tried to get in with the OBGYN in [place] about this but saw me first. Does not look like she has an appt with them yet. Due to her history of cardiomyopathy she is not having intercourse until she knows that her IUD is in the right place. On the ultrasound images, it looks like it could be in the myometrium, and the report says "within the uterine corpus". Looks like some images are better than others. If you have time to look at the images I would appreciate it. I know she is very anxious to have this taken care of.   Thanks!  Marcelino Duster

## 2022-09-02 NOTE — Telephone Encounter (Signed)
Called pt to set up appt for u/s to evaluate IUD placement. Pt is trying to get an appt with OB/GYN in [facility] for this issue. Will call back if unable to get an appt.

## 2022-09-12 NOTE — Progress Notes (Signed)
SUBJECTIVE:   Chief Complaint  Patient presents with   Transfer of Care   HPI Presents to clinic to transfer care  Cardiomyopathy/HFrEF/hypertension Follows with cardiology at [facility]. Currently on Entresto 97-103 mg twice daily, Jardiance 10 mg daily, hydrochlorothiazide 25 mg daily and metoprolol XL 25 mg twice daily.  Tolerating medication well Denies any headaches, visual changes, chest pain, shortness of breath, lower extremity edema nausea or vomiting.   Low vitamin 12 Previously taking vitamin B12 monthly injections.  Has not had medication in quite some time.  Denies any history of gastric bypass, malabsorption issues.  Agreeable to trial sublingual B12 if levels remain low.  Patient reports history of genital herpes.  Takes Valtrex 500 mg as needed.  Not currently having breakouts.  Has not been sexually active for 2 years secondary to her cardiomyopathy and her husband having medical complications with renal transplant.  She is concerned for possible transmission and is wondering if should restart medication.  Reports no recent outbreaks.  She cannot use condoms due to sensitivity.   PERTINENT PMH / PSH: Postpartum cardiomyopathy HFrEF OSA Hydradenitis supportive Hypertension Asthma Hepatic steatosis Morbid obesity Mirena IUD for conscious   OBJECTIVE:  BP 90/60   Pulse 86   Temp 98.2 F (36.8 C)   Resp 16   Ht 5' 3.75" (1.619 m)   Wt 244 lb 4 oz (110.8 kg)   SpO2 96%   BMI 42.25 kg/m    Physical Exam Vitals reviewed.  Constitutional:      General: She is not in acute distress.    Appearance: She is not ill-appearing.  HENT:     Head: Normocephalic.     Nose: Nose normal.  Eyes:     Conjunctiva/sclera: Conjunctivae normal.  Neck:     Thyroid: No thyromegaly or thyroid tenderness.  Cardiovascular:     Rate and Rhythm: Normal rate and regular rhythm.     Heart sounds: Normal heart sounds.  Pulmonary:     Effort: Pulmonary effort is normal.      Breath sounds: Normal breath sounds.  Abdominal:     General: Abdomen is flat. Bowel sounds are normal.     Palpations: Abdomen is soft.  Musculoskeletal:        General: Normal range of motion.     Cervical back: Normal range of motion.  Neurological:     Mental Status: She is alert and oriented to person, place, and time. Mental status is at baseline.  Psychiatric:        Mood and Affect: Mood normal.        Behavior: Behavior normal.        Thought Content: Thought content normal.        Judgment: Judgment normal.     ASSESSMENT/PLAN:  Primary hypertension Assessment & Plan: Chronic.  Well-controlled on current medications. Takes metoprolol, hydrochlorothiazide Follows with [facility] cardiology  Orders: -     Comprehensive metabolic panel  Morbid obesity with BMI of 40.0-44.9, adult [facility]) Assessment & Plan: Chronic. Healthy diet and exercise as tolerated    Orders: -     Hemoglobin A1c -     CBC with Differential/Platelet -     Comprehensive metabolic panel -     Lipid panel -     TSH  Breast cancer screening by mammogram -     3D Screening Mammogram, Left and Right; Future  HFrEF (heart failure with reduced ejection fraction) (HCC) Assessment & Plan: Chronic.  Asymptomatic and euvolemic  on exam. Currently taking metoprolol XL 25 mg twice daily, Entresto 97-103 mg twice daily, hydrochlorothiazide 25 mg daily and Jardiance 10 mg daily Continue to follow with [facility] cardiology as scheduled   Postpartum cardiomyopathy Assessment & Plan: Chronic. Asymptomatic. Follows with cardiology at [facility]   Fatty liver Assessment & Plan: Chronic.  Stable.  Likely secondary to elevated BMI. Check LFTs    Vitamin D deficiency Assessment & Plan: Chronic. Check vitamin D level   Orders: -     VITAMIN D 25 Hydroxy (Vit-D Deficiency, Fractures)  Vitamin B12 deficiency Assessment & Plan: Chronic.  Was previously on vitamin B12 injection monthly. Check levels today If  low can trial sublingual vitamin B12  Orders: -     Vitamin B12  Genital herpes simplex, unspecified site Assessment & Plan: Chronic.  No recent outbreaks.  Takes antivirals as needed. Recommend prophylactic use with Valtrex 500 mg daily Recommend she discuss with her husband transplant team and nephrology as this is their area of expertise.   HCM Last Pap 02/22.  Normal.  No HPV cotesting.  Due 02/25.  Previously followed by Dr. Elesa Massed at [facility]. Tdap up-to-date Colonoscopy up-to-date Mammogram due.  Referral sent.  Patient to schedule appointment.  PDMP reviewed  Return in about 6 months (around 03/15/2023) for PCP.  Dana Allan, MD

## 2022-09-12 NOTE — Patient Instructions (Incomplete)
It was a pleasure meeting you today. Thank you for allowing me to take part in your health care.  Our goals for today as we discussed include:  Recommend pneumonia 20 vaccine  We will get some labs.  If they are abnormal or we need to do something about them, I will call you.  If they are normal, I will send you a message on MyChart (if it is active) or a letter in the mail.  If you don't hear from [name] in 2 weeks, please call the office at the number below.   Referral sent for Mammogram. Please call to schedule appointment. [facility] [address] [place], [place] [address] [phone]   Follow-up with OB/GYN for next Pap.  Due 06/2023.  Dr. Leeroy Bock Ward Obstetrician-gynecologist [address]  [phone]   Dr. Leeroy Bock Ward Obstetrician-gynecologist [place], [place]  [phone]   Dr. Leeroy Bock Ward Obstetrician-gynecologist [place], [place]  [phone]  Follow-up with cardiology as scheduled  Follow up with PCP in 6 months or sooner if needed  Please arrive 15 minutes prior to your appointment.  Arrivals 5 minutes past your appointment time will need to be rescheduled.  This is to ensure that all patients are seen in a timely manner.  Thank you for your understanding and cooperation.   If you have any questions or concerns, please do not hesitate to call the office at [phone].  I look forward to our next visit and until then take care and stay safe.  Regards,   Dana Allan, MD   [facility]

## 2022-09-13 ENCOUNTER — Encounter: Payer: Self-pay | Admitting: Family Medicine

## 2022-09-13 ENCOUNTER — Ambulatory Visit (INDEPENDENT_AMBULATORY_CARE_PROVIDER_SITE_OTHER): Payer: BC Managed Care – PPO | Admitting: Family Medicine

## 2022-09-13 VITALS — BP 90/60 | HR 86 | Temp 98.2°F | Resp 16 | Ht 63.75 in | Wt 244.2 lb

## 2022-09-13 DIAGNOSIS — Z1231 Encounter for screening mammogram for malignant neoplasm of breast: Secondary | ICD-10-CM

## 2022-09-13 DIAGNOSIS — I10 Essential (primary) hypertension: Secondary | ICD-10-CM | POA: Diagnosis not present

## 2022-09-13 DIAGNOSIS — K760 Fatty (change of) liver, not elsewhere classified: Secondary | ICD-10-CM | POA: Diagnosis not present

## 2022-09-13 DIAGNOSIS — I429 Cardiomyopathy, unspecified: Secondary | ICD-10-CM

## 2022-09-13 DIAGNOSIS — E559 Vitamin D deficiency, unspecified: Secondary | ICD-10-CM

## 2022-09-13 DIAGNOSIS — A6000 Herpesviral infection of urogenital system, unspecified: Secondary | ICD-10-CM

## 2022-09-13 DIAGNOSIS — I5020 Unspecified systolic (congestive) heart failure: Secondary | ICD-10-CM

## 2022-09-13 DIAGNOSIS — E538 Deficiency of other specified B group vitamins: Secondary | ICD-10-CM

## 2022-09-13 DIAGNOSIS — Z6841 Body Mass Index (BMI) 40.0 and over, adult: Secondary | ICD-10-CM | POA: Diagnosis not present

## 2022-09-13 DIAGNOSIS — O903 Peripartum cardiomyopathy: Secondary | ICD-10-CM

## 2022-09-13 LAB — CBC WITH DIFFERENTIAL/PLATELET
Basophils Absolute: 0 10*3/uL (ref 0.0–0.1)
Basophils Relative: 0.2 % (ref 0.0–3.0)
Eosinophils Absolute: 0.1 10*3/uL (ref 0.0–0.7)
Eosinophils Relative: 1.4 % (ref 0.0–5.0)
HCT: 40.3 % (ref 36.0–46.0)
Hemoglobin: 12.9 g/dL (ref 12.0–15.0)
Lymphocytes Relative: 36.4 % (ref 12.0–46.0)
Lymphs Abs: 2.8 10*3/uL (ref 0.7–4.0)
MCHC: 32 g/dL (ref 30.0–36.0)
MCV: 85.9 fl (ref 78.0–100.0)
Monocytes Absolute: 0.6 10*3/uL (ref 0.1–1.0)
Monocytes Relative: 8.2 % (ref 3.0–12.0)
Neutro Abs: 4.1 10*3/uL (ref 1.4–7.7)
Neutrophils Relative %: 53.8 % (ref 43.0–77.0)
Platelets: 315 10*3/uL (ref 150.0–400.0)
RBC: 4.7 Mil/uL (ref 3.87–5.11)
RDW: 15.6 % — ABNORMAL HIGH (ref 11.5–15.5)
WBC: 7.6 10*3/uL (ref 4.0–10.5)

## 2022-09-13 LAB — COMPREHENSIVE METABOLIC PANEL
ALT: 14 U/L (ref 0–35)
AST: 13 U/L (ref 0–37)
Albumin: 4 g/dL (ref 3.5–5.2)
Alkaline Phosphatase: 72 U/L (ref 39–117)
BUN: 18 mg/dL (ref 6–23)
CO2: 29 mEq/L (ref 19–32)
Calcium: 9.1 mg/dL (ref 8.4–10.5)
Chloride: 99 mEq/L (ref 96–112)
Creatinine, Ser: 1.13 mg/dL (ref 0.40–1.20)
GFR: 57.89 mL/min — ABNORMAL LOW (ref >60.00)
Glucose, Bld: 86 mg/dL (ref 70–99)
Potassium: 4 mEq/L (ref 3.5–5.1)
Sodium: 136 mEq/L (ref 135–145)
Total Bilirubin: 0.6 mg/dL (ref 0.2–1.2)
Total Protein: 6.9 g/dL (ref 6.0–8.3)

## 2022-09-13 LAB — LIPID PANEL
Cholesterol: 139 mg/dL (ref 0–200)
HDL: 32 mg/dL — ABNORMAL LOW (ref >39.00)
LDL Cholesterol: 87 mg/dL (ref 0–99)
NonHDL: 107.16
Total CHOL/HDL Ratio: 4
Triglycerides: 99 mg/dL (ref 0.0–149.0)
VLDL: 19.8 mg/dL (ref 0.0–40.0)

## 2022-09-13 LAB — TSH: TSH: 1.02 u[IU]/mL (ref 0.35–5.50)

## 2022-09-13 LAB — HEMOGLOBIN A1C: Hgb A1c MFr Bld: 5.9 % (ref 4.6–6.5)

## 2022-09-13 LAB — VITAMIN D 25 HYDROXY (VIT D DEFICIENCY, FRACTURES): VITD: 26.24 ng/mL — ABNORMAL LOW (ref 30.00–100.00)

## 2022-09-13 LAB — VITAMIN B12: Vitamin B-12: 182 pg/mL — ABNORMAL LOW (ref 211–911)

## 2022-09-14 ENCOUNTER — Other Ambulatory Visit: Payer: Self-pay | Admitting: Family Medicine

## 2022-09-14 DIAGNOSIS — E559 Vitamin D deficiency, unspecified: Secondary | ICD-10-CM

## 2022-09-14 DIAGNOSIS — E538 Deficiency of other specified B group vitamins: Secondary | ICD-10-CM

## 2022-09-14 MED ORDER — VITAMIN B-12 1000 MCG SL SUBL
1000.0000 ug | SUBLINGUAL_TABLET | Freq: Every day | SUBLINGUAL | 3 refills | Status: DC
Start: 2022-09-14 — End: 2023-03-15

## 2022-09-14 MED ORDER — VITAMIN D (ERGOCALCIFEROL) 1.25 MG (50000 UNIT) PO CAPS: 50000.0000 [IU] | ORAL_CAPSULE | ORAL | 3 refills | Status: DC

## 2022-09-19 ENCOUNTER — Encounter: Payer: Self-pay | Admitting: Family Medicine

## 2022-09-19 DIAGNOSIS — E538 Deficiency of other specified B group vitamins: Secondary | ICD-10-CM

## 2022-09-19 DIAGNOSIS — Z1231 Encounter for screening mammogram for malignant neoplasm of breast: Secondary | ICD-10-CM | POA: Insufficient documentation

## 2022-09-19 DIAGNOSIS — A6000 Herpesviral infection of urogenital system, unspecified: Secondary | ICD-10-CM | POA: Insufficient documentation

## 2022-09-19 HISTORY — DX: Deficiency of other specified B group vitamins: E53.8

## 2022-09-19 NOTE — Assessment & Plan Note (Signed)
Chronic. Asymptomatic. Follows with cardiology at [facility]

## 2022-09-19 NOTE — Assessment & Plan Note (Addendum)
Chronic.  Stable.  Likely secondary to elevated BMI. Check LFTs

## 2022-09-19 NOTE — Assessment & Plan Note (Signed)
Chronic.  Well-controlled on current medications. Takes metoprolol, hydrochlorothiazide Follows with [facility] cardiology

## 2022-09-19 NOTE — Assessment & Plan Note (Addendum)
Chronic.  Was previously on vitamin B12 injection monthly. Check levels today If low can trial sublingual vitamin B12

## 2022-09-19 NOTE — Assessment & Plan Note (Signed)
Chronic.  Asymptomatic and euvolemic on exam. Currently taking metoprolol XL 25 mg twice daily, Entresto 97-103 mg twice daily, hydrochlorothiazide 25 mg daily and Jardiance 10 mg daily Continue to follow with [facility] cardiology as scheduled

## 2022-09-19 NOTE — Assessment & Plan Note (Signed)
Chronic Check vitamin D level 

## 2022-09-19 NOTE — Assessment & Plan Note (Signed)
Chronic.  No recent outbreaks.  Takes antivirals as needed. Recommend prophylactic use with Valtrex 500 mg daily Recommend she discuss with her husband transplant team and nephrology as this is their area of expertise.

## 2022-09-19 NOTE — Assessment & Plan Note (Signed)
Chronic. Healthy diet and exercise as tolerated

## 2022-12-10 ENCOUNTER — Ambulatory Visit
Admission: RE | Admit: 2022-12-10 | Discharge: 2022-12-10 | Disposition: A | Discharge status: Home / Self Care | Payer: BC Managed Care – PPO | Source: Ambulatory Visit | Attending: Family Medicine | Admitting: Family Medicine

## 2022-12-10 DIAGNOSIS — Z1231 Encounter for screening mammogram for malignant neoplasm of breast: Secondary | ICD-10-CM | POA: Diagnosis not present

## 2022-12-14 DIAGNOSIS — K600 Acute anal fissure: Secondary | ICD-10-CM | POA: Diagnosis not present

## 2023-01-25 DIAGNOSIS — K600 Acute anal fissure: Secondary | ICD-10-CM | POA: Diagnosis not present

## 2023-01-25 DIAGNOSIS — R198 Other specified symptoms and signs involving the digestive system and abdomen: Secondary | ICD-10-CM | POA: Diagnosis not present

## 2023-03-07 DIAGNOSIS — O903 Peripartum cardiomyopathy: Secondary | ICD-10-CM | POA: Diagnosis not present

## 2023-03-07 DIAGNOSIS — E538 Deficiency of other specified B group vitamins: Secondary | ICD-10-CM | POA: Diagnosis not present

## 2023-03-07 DIAGNOSIS — I10 Essential (primary) hypertension: Secondary | ICD-10-CM | POA: Diagnosis not present

## 2023-03-07 DIAGNOSIS — Z23 Encounter for immunization: Secondary | ICD-10-CM | POA: Diagnosis not present

## 2023-03-07 DIAGNOSIS — R7989 Other specified abnormal findings of blood chemistry: Secondary | ICD-10-CM | POA: Diagnosis not present

## 2023-03-07 DIAGNOSIS — I429 Cardiomyopathy, unspecified: Secondary | ICD-10-CM | POA: Diagnosis not present

## 2023-03-07 DIAGNOSIS — Z6841 Body Mass Index (BMI) 40.0 and over, adult: Secondary | ICD-10-CM | POA: Diagnosis not present

## 2023-03-07 DIAGNOSIS — I110 Hypertensive heart disease with heart failure: Secondary | ICD-10-CM | POA: Diagnosis not present

## 2023-03-07 DIAGNOSIS — I5020 Unspecified systolic (congestive) heart failure: Secondary | ICD-10-CM | POA: Diagnosis not present

## 2023-03-07 DIAGNOSIS — E66813 Obesity, class 3: Secondary | ICD-10-CM | POA: Diagnosis not present

## 2023-03-15 ENCOUNTER — Ambulatory Visit (INDEPENDENT_AMBULATORY_CARE_PROVIDER_SITE_OTHER): Payer: BC Managed Care – PPO | Admitting: Family Medicine

## 2023-03-15 ENCOUNTER — Encounter: Payer: Self-pay | Admitting: Family Medicine

## 2023-03-15 VITALS — BP 102/72 | HR 80 | Temp 97.9°F | Resp 16 | Ht 64.0 in | Wt 238.1 lb

## 2023-03-15 DIAGNOSIS — E538 Deficiency of other specified B group vitamins: Secondary | ICD-10-CM

## 2023-03-15 DIAGNOSIS — Q670 Congenital facial asymmetry: Secondary | ICD-10-CM | POA: Diagnosis not present

## 2023-03-15 DIAGNOSIS — I10 Essential (primary) hypertension: Secondary | ICD-10-CM

## 2023-03-15 DIAGNOSIS — R413 Other amnesia: Secondary | ICD-10-CM

## 2023-03-15 DIAGNOSIS — Z6841 Body Mass Index (BMI) 40.0 and over, adult: Secondary | ICD-10-CM

## 2023-03-15 DIAGNOSIS — O903 Peripartum cardiomyopathy: Secondary | ICD-10-CM

## 2023-03-15 MED ORDER — CYANOCOBALAMIN 1000 MCG/ML IJ SOLN: 1000.0000 ug | Freq: Once | INTRAMUSCULAR | 0 refills | Status: AC

## 2023-03-15 NOTE — Progress Notes (Signed)
SUBJECTIVE:   Chief Complaint  Patient presents with   Hypertension   HPI Presents to clinic for chronic disease management  Discussed the use of AI scribe software for clinical note transcription with the patient, who gave verbal consent to proceed.  History of Present Illness The patient, with a history of postpartum cardiomyopathy and currently on metoprolol and Entresto, presents for a routine annual follow-up. They report experiencing intermittent episodes of "brain fog" over the past month, with one significant episode of disorientation while driving. The patient also notes a longstanding concern about asymmetry in their facial features, with one side of the face appearing larger than the other. This has been a concern for over a year, with a possible slight increase in prominence. The patient denies any associated pain or other symptoms.  In addition, the patient reports a recent blood work result from their cardiologist showing a slightly elevated anion gap for the first time. They express concern about this new finding, given their understanding that it could be indicative of diabetes. However, the patient reports no symptoms suggestive of diabetes.  The patient also mentions a habit of chewing on the buccal area, more prominently on the side of the face they perceive as larger. They deny any recent weight gain or changes in diet. The patient also reports good sleep quality and denies any known history of sleep apnea.  The patient's memory concerns predate their current heart medications and they express frustration about feeling their memory is not as good as it should be. They report difficulty remembering certain details about their children's milestones and teachers, which they believe most parents would remember. The patient does not report any acute memory loss episodes.  The patient's children recently left home, marking a significant life change for the patient. Despite this,  the patient reports feeling less stressed than usual due to a recent change in their work environment. They deny any significant stressors in their life currently.    PERTINENT PMH / PSH: As above  OBJECTIVE:  BP 102/72   Pulse 80   Temp 97.9 F (36.6 C)   Resp 16   Ht 5\' 4"  (1.626 m)   Wt 238 lb 2 oz (108 kg)   SpO2 98%   BMI 40.87 kg/m    Physical Exam Vitals reviewed.  Constitutional:      General: She is not in acute distress.    Appearance: Normal appearance. She is normal weight. She is not ill-appearing, toxic-appearing or diaphoretic.  HENT:     Head: Atraumatic.     Jaw: Swelling present. No tenderness or pain on movement.     Salivary Glands: Right salivary gland is not diffusely enlarged. Left salivary gland is not diffusely enlarged.      Right Ear: Tympanic membrane, ear canal and external ear normal.     Left Ear: Tympanic membrane, ear canal and external ear normal.  Eyes:     General:        Right eye: No discharge.        Left eye: No discharge.     Conjunctiva/sclera: Conjunctivae normal.  Cardiovascular:     Rate and Rhythm: Normal rate and regular rhythm.     Heart sounds: Normal heart sounds.  Pulmonary:     Effort: Pulmonary effort is normal.     Breath sounds: Normal breath sounds.  Abdominal:     General: Bowel sounds are normal.  Musculoskeletal:        General: Normal  range of motion.  Skin:    General: Skin is warm and dry.  Neurological:     General: No focal deficit present.     Mental Status: She is alert and oriented to person, place, and time. Mental status is at baseline.  Psychiatric:        Mood and Affect: Mood normal.        Behavior: Behavior normal.        Thought Content: Thought content normal.        Judgment: Judgment normal.        03/15/2023   10:04 AM 09/13/2022    9:04 AM 03/03/2022    9:55 AM 11/17/2021    9:56 AM 05/12/2021    9:53 AM  Depression screen PHQ 2/9  Decreased Interest 0 0 0 0 0  Down,  Depressed, Hopeless 0 0 0 0 1  PHQ - 2 Score 0 0 0 0 1  Altered sleeping 0 0     Tired, decreased energy 0 1     Change in appetite 0 0     Feeling bad or failure about yourself  0 0     Trouble concentrating 1 0     Moving slowly or fidgety/restless 0 0     Suicidal thoughts 0 0     PHQ-9 Score 1 1     Difficult doing work/chores Not difficult at all Not difficult at all         03/15/2023   10:04 AM 09/13/2022    9:04 AM 05/09/2020   10:06 AM 11/02/2019    9:19 AM  GAD 7 : Generalized Anxiety Score  Nervous, Anxious, on Edge 0 1 2 1   Control/stop worrying 0 0 2 1  Worry too much - different things 0 0 2 2  Trouble relaxing 0 0 2 1  Restless 0 0 1 1  Easily annoyed or irritable 0 0 2 3  Afraid - awful might happen 0 1 2 1   Total GAD 7 Score 0 2 13 10   Anxiety Difficulty Not difficult at all Not difficult at all  Somewhat difficult    ASSESSMENT/PLAN:  Primary hypertension Assessment & Plan: Well-controlled on current medication regimen. -Continue current medications. -Follow-up in 6 months or sooner if symptoms develop.    Vitamin B12 deficiency Assessment & Plan: Unable to get sublingual B 12 -Requesting to restart monthly injections -Recent B12 low normal -Refill Vitamin B 12 1000mg  IM monthly  Orders: -     Cyanocobalamin; Inject 1 mL (1,000 mcg total) into the muscle once for 1 dose.  Dispense: 1 mL; Refill: 0  Facial asymmetry Assessment & Plan: Noted by patient for over a year, no associated pain or other symptoms. No palpable masses or lumps. No lymphadenopathy. -Has upcoming dentist for further evaluation.    Memory change Assessment & Plan: Recent episodes of memory lapses and brain fog, no associated headaches, visual changes, slurred speech, or dizziness. No significant changes in stress levels or sleep patterns. -Monitor symptoms closely. -Possible perimenopausal.  Provided education and support.  Recent labs reassuring.   -Consider referral to  neurology and MRI brain if symptoms persist or worsen.   Postpartum cardiomyopathy Assessment & Plan: Managed with Entresto, Metoprolol XL.   Slight elevation in recent labs, no associated symptoms. -Encourage increased hydration due to slightly elevated creatinine. -Follows with cardiology at [facility], recommend she discuss labs with provider who ordered.   Morbid obesity with BMI of 40.0-44.9, adult [facility]) Assessment &  Plan: Possibly contributing to fatigue and brain fog. Healthy diet and exercise as tolerated       PDMP reviewed  Return if symptoms worsen or fail to improve, for PCP.  Dana Allan, MD

## 2023-03-15 NOTE — Patient Instructions (Addendum)
It was a pleasure meeting you today. Thank you for allowing me to take part in your health care.  Our goals for today as we discussed include:  Continue current medications  Follow up with dentist    Continue current medication  Restart Vitamin B 12 injections. Refill sent to pharmacy  Follow up as needed   If you have any questions or concerns, please do not hesitate to call the office at [phone].  I look forward to our next visit and until then take care and stay safe.  Regards,   Dana Allan, MD   [facility]

## 2023-03-18 ENCOUNTER — Encounter: Payer: Self-pay | Admitting: Family Medicine

## 2023-03-18 DIAGNOSIS — Q670 Congenital facial asymmetry: Secondary | ICD-10-CM | POA: Insufficient documentation

## 2023-03-18 NOTE — Assessment & Plan Note (Signed)
Well-controlled on current medication regimen. -Continue current medications. -Follow-up in 6 months or sooner if symptoms develop.

## 2023-03-18 NOTE — Assessment & Plan Note (Signed)
Recent episodes of memory lapses and brain fog, no associated headaches, visual changes, slurred speech, or dizziness. No significant changes in stress levels or sleep patterns. -Monitor symptoms closely. -Possible perimenopausal.  Provided education and support.  Recent labs reassuring.   -Consider referral to neurology and MRI brain if symptoms persist or worsen.

## 2023-03-18 NOTE — Assessment & Plan Note (Deleted)
Unable to get sublingual B 12 -Requesting to restart monthly injections -Recent B12 low normal -Refill Vitamin B 12 1000mg  IM monthly

## 2023-03-18 NOTE — Assessment & Plan Note (Signed)
Possibly contributing to fatigue and brain fog. Healthy diet and exercise as tolerated

## 2023-03-18 NOTE — Assessment & Plan Note (Signed)
Managed with Entresto, Metoprolol XL.   Slight elevation in recent labs, no associated symptoms. -Encourage increased hydration due to slightly elevated creatinine. -Follows with cardiology at [facility], recommend she discuss labs with provider who ordered.

## 2023-03-18 NOTE — Assessment & Plan Note (Signed)
Unable to get sublingual B 12 -Requesting to restart monthly injections -Recent B12 low normal -Refill Vitamin B 12 1000mg  IM monthly

## 2023-03-18 NOTE — Assessment & Plan Note (Signed)
Noted by patient for over a year, no associated pain or other symptoms. No palpable masses or lumps. No lymphadenopathy. -Has upcoming dentist for further evaluation.

## 2023-08-25 ENCOUNTER — Ambulatory Visit: Admitting: Family Medicine

## 2023-08-25 ENCOUNTER — Encounter: Payer: Self-pay | Admitting: Family Medicine

## 2023-08-25 VITALS — BP 120/70 | HR 91 | Temp 98.3°F | Resp 20 | Ht 64.0 in | Wt 239.2 lb

## 2023-08-25 DIAGNOSIS — R300 Dysuria: Secondary | ICD-10-CM | POA: Diagnosis not present

## 2023-08-25 DIAGNOSIS — R319 Hematuria, unspecified: Secondary | ICD-10-CM | POA: Diagnosis not present

## 2023-08-25 DIAGNOSIS — T8332XA Displacement of intrauterine contraceptive device, initial encounter: Secondary | ICD-10-CM | POA: Insufficient documentation

## 2023-08-25 DIAGNOSIS — N938 Other specified abnormal uterine and vaginal bleeding: Secondary | ICD-10-CM | POA: Insufficient documentation

## 2023-08-25 LAB — POC URINALSYSI DIPSTICK (AUTOMATED)
Bilirubin, UA: NEGATIVE
Glucose, UA: POSITIVE — AB
Ketones, UA: NEGATIVE
Leukocytes, UA: NEGATIVE
Nitrite, UA: NEGATIVE
Protein, UA: POSITIVE — AB
Spec Grav, UA: 1.02 (ref 1.010–1.025)
Urobilinogen, UA: 0.2 U/dL
pH, UA: 5.5 (ref 5.0–8.0)

## 2023-08-25 NOTE — Patient Instructions (Addendum)
 It was a pleasure meeting you today. Thank you for allowing me to take part in your health care.  Our goals for today as we discussed include:  We will get some labs today.  If they are abnormal or we need to do something about them, I will call you.  If they are normal, I will send you a message on MyChart (if it is active) or a letter in the mail.  If you don't hear from [name] in 2 weeks, please call the office at the number below.   Follow up with OBGYN for fibroids.  PAP is also due  Follow up with [facility] for annual MRI liver lesions  Strain urine No infection noted in urine Sending for further evaluation  If any worsening please notify MD    This is a list of the screening recommended for you and due dates:  Health Maintenance  Topic Date Due   Pneumococcal Vaccination (1 of 2 - PCV) Never done   Pap with HPV screening  07/09/2023   Flu Shot  12/23/2023   DTaP/Tdap/Td vaccine (2 - Td or Tdap) 11/18/2031   Colon Cancer Screening  07/29/2032   Hepatitis C Screening  Completed   HIV Screening  Completed   HPV Vaccine  Aged Out   COVID-19 Vaccine  Discontinued      If you have any questions or concerns, please do not hesitate to call the office at [phone].  I look forward to our next visit and until then take care and stay safe.  Regards,   Dana Allan, MD   [facility]

## 2023-08-25 NOTE — Progress Notes (Signed)
 SUBJECTIVE:   Chief Complaint  Patient presents with   Urinary Tract Infection   HPI Presents for acute visit  Discussed the use of AI scribe software for clinical note transcription with the patient, who gave verbal consent to proceed.  History of Present Illness Stacie Gill is a 48 year old female who presents with symptoms suggestive of a urinary tract infection.  Symptoms began on Tuesday with lower back pain at her part-time job, which impaired her ability to complete her shift. The back pain subsided, but she developed dysuria, particularly at the end of urination, and pelvic discomfort. No visible hematuria, fever, groin pain, or significant changes in urinary frequency, though she reports frequent urination. No decrease in urine output.  She has a history of a urinary tract infection in the 1990s, described as 'horrific'. Approximately 15-20 years ago, she was suspected to have kidney stones, but evaluations were negative. Her mother provided a home remedy, which may have resolved any minor stones. She was previously evaluated by urology for trace hematuria, leading to catheterization to ensure complete bladder emptying, with normal results and no identified cause for the hematuria.  Current medications include Jardiance, taken for over a year, and B12 injections, which she started at the current facility but had stopped for a period. She consumes orange juice, water, and tea, but estimates her water intake at about two bottles a day, acknowledging it is less than recommended.  No history of smoking or use of blood thinners. No unusual vaginal discharge beyond her norm. She is concerned about managing her symptoms before an upcoming vacation.    PERTINENT PMH / PSH: As above  OBJECTIVE:  BP 120/70   Pulse 91   Temp 98.3 F (36.8 C)   Resp 20   Ht 5\' 4"  (1.626 m)   Wt 239 lb 4 oz (108.5 kg)   SpO2 98%   BMI 41.07 kg/m    Physical Exam Vitals reviewed.   Constitutional:      General: She is not in acute distress.    Appearance: Normal appearance. She is obese. She is not ill-appearing, toxic-appearing or diaphoretic.  Eyes:     General:        Right eye: No discharge.        Left eye: No discharge.     Conjunctiva/sclera: Conjunctivae normal.  Cardiovascular:     Rate and Rhythm: Normal rate and regular rhythm.     Heart sounds: Normal heart sounds.  Pulmonary:     Effort: Pulmonary effort is normal.     Breath sounds: Normal breath sounds.  Abdominal:     General: Bowel sounds are normal.     Tenderness: There is no abdominal tenderness. There is no right CVA tenderness, left CVA tenderness, guarding or rebound.  Musculoskeletal:        General: Normal range of motion.  Skin:    General: Skin is warm and dry.  Neurological:     General: No focal deficit present.     Mental Status: She is alert and oriented to person, place, and time. Mental status is at baseline.  Psychiatric:        Mood and Affect: Mood normal.        Behavior: Behavior normal.        Thought Content: Thought content normal.        Judgment: Judgment normal.           08/25/2023    2:48 PM 03/15/2023  10:04 AM 09/13/2022    9:04 AM 03/03/2022    9:55 AM 11/17/2021    9:56 AM  Depression screen PHQ 2/9  Decreased Interest 0 0 0 0 0  Down, Depressed, Hopeless 0 0 0 0 0  PHQ - 2 Score 0 0 0 0 0  Altered sleeping 0 0 0    Tired, decreased energy 0 0 1    Change in appetite 0 0 0    Feeling bad or failure about yourself  0 0 0    Trouble concentrating 0 1 0    Moving slowly or fidgety/restless 0 0 0    Suicidal thoughts 0 0 0    PHQ-9 Score 0 1 1    Difficult doing work/chores Not difficult at all Not difficult at all Not difficult at all        08/25/2023    2:49 PM 03/15/2023   10:04 AM 09/13/2022    9:04 AM 05/09/2020   10:06 AM  GAD 7 : Generalized Anxiety Score  Nervous, Anxious, on Edge 0 0 1 2  Control/stop worrying 0 0 0 2  Worry too  much - different things 0 0 0 2  Trouble relaxing 0 0 0 2  Restless 0 0 0 1  Easily annoyed or irritable 0 0 0 2  Afraid - awful might happen 0 0 1 2  Total GAD 7 Score 0 0 2 13  Anxiety Difficulty Not difficult at all Not difficult at all Not difficult at all     ASSESSMENT/PLAN:  Burning with urination Assessment & Plan: Symptoms suggestive of UTI but urinalysis lacks infection markers. Consider early-stage UTI. - Send urine for culture to confirm or exclude infection. - Consider antibiotics if culture confirms infection or symptoms worsen. - Advise use of phenazopyridine, probiotics, and increased fluid intake pending culture results.  Orders: -     POCT Urinalysis Dipstick (Automated) -     Urinalysis, Routine w reflex microscopic -     Urine Culture -     Comprehensive metabolic panel with GFR -     CBC with Differential/Platelet  Hematuria, unspecified type Assessment & Plan: Chronic Hematuria with no infection signs. Possible nephrolithiasis due to lower back pain and dysuria. Previous urology evaluation unremarkable. Discussed nephrolithiasis as potential cause.  Symptoms of back pain have improved.  - Perform blood work for renal function assessment. - Prescribe tamsulosin for stone passage. - Advise increased fluid intake, including water and cranberry juice. - Provide urine collection kit for stone analysis. - Consider antibiotics if symptoms worsen or urine culture indicates infection. - Advise monitoring for fever and report symptom exacerbation. - If no improvement in symptoms will obtain imaging     PDMP reviewed  Return if symptoms worsen or fail to improve, for PCP.  Valli Gaw, MD

## 2023-08-26 LAB — COMPREHENSIVE METABOLIC PANEL WITH GFR
ALT: 10 U/L (ref 0–35)
AST: 9 U/L (ref 0–37)
Albumin: 4.3 g/dL (ref 3.5–5.2)
Alkaline Phosphatase: 69 U/L (ref 39–117)
BUN: 16 mg/dL (ref 6–23)
CO2: 28 meq/L (ref 19–32)
Calcium: 9.1 mg/dL (ref 8.4–10.5)
Chloride: 101 meq/L (ref 96–112)
Creatinine, Ser: 1.22 mg/dL — ABNORMAL HIGH (ref 0.40–1.20)
GFR: 52.45 mL/min — ABNORMAL LOW (ref >60.00)
Glucose, Bld: 81 mg/dL (ref 70–99)
Potassium: 4.1 meq/L (ref 3.5–5.1)
Sodium: 138 meq/L (ref 135–145)
Total Bilirubin: 0.6 mg/dL (ref 0.2–1.2)
Total Protein: 7.6 g/dL (ref 6.0–8.3)

## 2023-08-26 LAB — URINALYSIS, ROUTINE W REFLEX MICROSCOPIC
Bilirubin Urine: NEGATIVE
Ketones, ur: NEGATIVE
Leukocytes,Ua: NEGATIVE
Nitrite: NEGATIVE
Specific Gravity, Urine: 1.02 (ref 1.000–1.030)
Total Protein, Urine: NEGATIVE
Urine Glucose: 500 — AB
Urobilinogen, UA: 0.2 (ref 0.0–1.0)
pH: 6 (ref 5.0–8.0)

## 2023-08-26 LAB — CBC WITH DIFFERENTIAL/PLATELET
Basophils Absolute: 0 10*3/uL (ref 0.0–0.1)
Basophils Relative: 0.6 % (ref 0.0–3.0)
Eosinophils Absolute: 0.1 10*3/uL (ref 0.0–0.7)
Eosinophils Relative: 0.8 % (ref 0.0–5.0)
HCT: 40.6 % (ref 36.0–46.0)
Hemoglobin: 13.1 g/dL (ref 12.0–15.0)
Lymphocytes Relative: 42.9 % (ref 12.0–46.0)
Lymphs Abs: 3.2 10*3/uL (ref 0.7–4.0)
MCHC: 32.3 g/dL (ref 30.0–36.0)
MCV: 86 fl (ref 78.0–100.0)
Monocytes Absolute: 0.5 10*3/uL (ref 0.1–1.0)
Monocytes Relative: 7.2 % (ref 3.0–12.0)
Neutro Abs: 3.7 10*3/uL (ref 1.4–7.7)
Neutrophils Relative %: 48.5 % (ref 43.0–77.0)
Platelets: 317 10*3/uL (ref 150.0–400.0)
RBC: 4.72 Mil/uL (ref 3.87–5.11)
RDW: 15.6 % — ABNORMAL HIGH (ref 11.5–15.5)
WBC: 7.5 10*3/uL (ref 4.0–10.5)

## 2023-08-26 LAB — URINE CULTURE
MICRO NUMBER:: 16285312
Result:: NO GROWTH
SPECIMEN QUALITY:: ADEQUATE

## 2023-08-30 DIAGNOSIS — D2261 Melanocytic nevi of right upper limb, including shoulder: Secondary | ICD-10-CM | POA: Diagnosis not present

## 2023-08-30 DIAGNOSIS — D225 Melanocytic nevi of trunk: Secondary | ICD-10-CM | POA: Diagnosis not present

## 2023-08-30 DIAGNOSIS — D2272 Melanocytic nevi of left lower limb, including hip: Secondary | ICD-10-CM | POA: Diagnosis not present

## 2023-08-30 DIAGNOSIS — D2262 Melanocytic nevi of left upper limb, including shoulder: Secondary | ICD-10-CM | POA: Diagnosis not present

## 2023-08-31 ENCOUNTER — Other Ambulatory Visit: Payer: Self-pay | Admitting: Family Medicine

## 2023-08-31 ENCOUNTER — Encounter: Payer: Self-pay | Admitting: Family Medicine

## 2023-08-31 DIAGNOSIS — N200 Calculus of kidney: Secondary | ICD-10-CM

## 2023-08-31 MED ORDER — TAMSULOSIN HCL 0.4 MG PO CAPS: 0.4000 mg | ORAL_CAPSULE | Freq: Every day | ORAL | 0 refills | Status: DC

## 2023-09-07 ENCOUNTER — Encounter: Payer: Self-pay | Admitting: Family Medicine

## 2023-09-07 DIAGNOSIS — R300 Dysuria: Secondary | ICD-10-CM | POA: Insufficient documentation

## 2023-09-07 DIAGNOSIS — R319 Hematuria, unspecified: Secondary | ICD-10-CM | POA: Insufficient documentation

## 2023-09-07 NOTE — Assessment & Plan Note (Signed)
 Symptoms suggestive of UTI but urinalysis lacks infection markers. Consider early-stage UTI. - Send urine for culture to confirm or exclude infection. - Consider antibiotics if culture confirms infection or symptoms worsen. - Advise use of phenazopyridine, probiotics, and increased fluid intake pending culture results.

## 2023-09-07 NOTE — Assessment & Plan Note (Addendum)
 Chronic Hematuria with no infection signs. Possible nephrolithiasis due to lower back pain and dysuria. Previous urology evaluation unremarkable. Discussed nephrolithiasis as potential cause.  Symptoms of back pain have improved.  - Perform blood work for renal function assessment. - Prescribe tamsulosin for stone passage. - Advise increased fluid intake, including water and cranberry juice. - Provide urine collection kit for stone analysis. - Consider antibiotics if symptoms worsen or urine culture indicates infection. - Advise monitoring for fever and report symptom exacerbation. - If no improvement in symptoms will obtain imaging

## 2023-09-13 ENCOUNTER — Encounter: Payer: Self-pay | Admitting: Family Medicine

## 2023-09-13 ENCOUNTER — Ambulatory Visit (INDEPENDENT_AMBULATORY_CARE_PROVIDER_SITE_OTHER): Payer: BC Managed Care – PPO | Admitting: Family Medicine

## 2023-09-13 VITALS — BP 120/62 | HR 83 | Temp 98.0°F | Resp 20 | Ht 64.0 in | Wt 236.4 lb

## 2023-09-13 DIAGNOSIS — K648 Other hemorrhoids: Secondary | ICD-10-CM | POA: Diagnosis not present

## 2023-09-13 DIAGNOSIS — I10 Essential (primary) hypertension: Secondary | ICD-10-CM | POA: Diagnosis not present

## 2023-09-13 DIAGNOSIS — K625 Hemorrhage of anus and rectum: Secondary | ICD-10-CM | POA: Diagnosis not present

## 2023-09-13 NOTE — Patient Instructions (Addendum)
 It was a pleasure meeting you today. Thank you for allowing me to take part in your health care.  Our goals for today as we discussed include:  Last hemoglobin is normal  Follow up with Dr Mamie Searles at [facility] If referral needed please let me know   This is a list of the screening recommended for you and due dates:  Health Maintenance  Topic Date Due   Pneumococcal Vaccination (1 of 2 - PCV) Never done   Pap with HPV screening  07/09/2023   Flu Shot  12/23/2023   DTaP/Tdap/Td vaccine (2 - Td or Tdap) 11/18/2031   Colon Cancer Screening  07/29/2032   Hepatitis C Screening  Completed   HIV Screening  Completed   HPV Vaccine  Aged Out   Meningitis B Vaccine  Aged Out   COVID-19 Vaccine  Discontinued      If you have any questions or concerns, please do not hesitate to call the office at [phone].  I look forward to our next visit and until then take care and stay safe.  Regards,   Valli Gaw, MD   [facility]

## 2023-09-13 NOTE — Progress Notes (Addendum)
 SUBJECTIVE:   Chief Complaint  Patient presents with   Medical Management of Chronic Issues    6 month follow up   HPI Follow up for chronic disease management  Discussed the use of AI scribe software for clinical note transcription with the patient, who gave verbal consent to proceed.  History of Present Illness Stacie Gill is a 48 year old female with internal hemorrhoids and diverticulosis who presents with rectal bleeding.  She experiences sporadic and random rectal bleeding approximately once a month, characterized by bright red blood on the tissue after wiping and occasionally on the stool. The bleeding does not persist with continued wiping and is not associated with pain, which she differentiates from her typical hemorrhoid symptoms.  She has a history of internal hemorrhoids and diverticulosis, confirmed by a previous colonoscopy. She also had a rectal fissure in the past, which has healed. She does not have a gallbladder, which previously affected her bowel habits. She experiences a sensation of incomplete bowel evacuation despite having soft stools and not needing to strain. She typically has multiple bowel movements a day, attributed to dietary changes.  Her current medications include metoprolol , Entresto, hydrochlorothiazide, and Jardiance, all managed by her cardiologist. She does not require refills at this time. She previously used Flomax  as a precaution during a trip but is not currently taking it.  No diarrhea, mucus in the stool, or significant changes in stool habits aside from the sensation of incomplete evacuation. No pain associated with hemorrhoids or bleeding.    PERTINENT PMH / PSH: As above  OBJECTIVE:  BP 120/62   Pulse 83   Temp 98 F (36.7 C)   Resp 20   Ht 5\' 4"  (1.626 m)   Wt 236 lb 6 oz (107.2 kg)   SpO2 97%   BMI 40.57 kg/m    Physical Exam Vitals reviewed.  Constitutional:      General: She is not in acute distress.     Appearance: She is obese. She is not ill-appearing.  HENT:     Head: Normocephalic.     Nose: Nose normal.  Eyes:     Conjunctiva/sclera: Conjunctivae normal.  Cardiovascular:     Rate and Rhythm: Normal rate and regular rhythm.     Heart sounds: Normal heart sounds.  Pulmonary:     Effort: Pulmonary effort is normal.     Breath sounds: Normal breath sounds.  Abdominal:     General: Abdomen is flat. Bowel sounds are normal.     Palpations: Abdomen is soft.  Musculoskeletal:        General: Normal range of motion.     Cervical back: Normal range of motion.  Neurological:     Mental Status: She is alert and oriented to person, place, and time. Mental status is at baseline.  Psychiatric:        Mood and Affect: Mood normal.        Behavior: Behavior normal.        Thought Content: Thought content normal.        Judgment: Judgment normal.           09/13/2023   11:44 AM 08/25/2023    2:48 PM 03/15/2023   10:04 AM 09/13/2022    9:04 AM 03/03/2022    9:55 AM  Depression screen PHQ 2/9  Decreased Interest 0 0 0 0 0  Down, Depressed, Hopeless 0 0 0 0 0  PHQ - 2 Score 0 0 0 0 0  Altered sleeping 0 0 0 0   Tired, decreased energy 0 0 0 1   Change in appetite 0 0 0 0   Feeling bad or failure about yourself  0 0 0 0   Trouble concentrating 0 0 1 0   Moving slowly or fidgety/restless 0 0 0 0   Suicidal thoughts 0 0 0 0   PHQ-9 Score 0 0 1 1   Difficult doing work/chores Not difficult at all Not difficult at all Not difficult at all Not difficult at all       09/13/2023   11:44 AM 08/25/2023    2:49 PM 03/15/2023   10:04 AM 09/13/2022    9:04 AM  GAD 7 : Generalized Anxiety Score  Nervous, Anxious, on Edge 1 0 0 1  Control/stop worrying 1 0 0 0  Worry too much - different things 0 0 0 0  Trouble relaxing 0 0 0 0  Restless 0 0 0 0  Easily annoyed or irritable 0 0 0 0  Afraid - awful might happen 1 0 0 1  Total GAD 7 Score 3 0 0 2  Anxiety Difficulty Somewhat difficult Not  difficult at all Not difficult at all Not difficult at all    ASSESSMENT/PLAN:  Rectal bleeding -     Fecal occult blood, imunochemical; Future  Internal hemorrhoids Assessment & Plan: Chronic.  Seen on last colonoscopy.  Intermittent bright red blood on stool likely due to internal hemorrhoids. Hemoglobin levels previously stable, re-evaluation needed. Discussed potential hemorrhoid banding if bleeding significant.  - Order stool sample for occult blood. - Follow up with GI for further evaluation and potential banding of hemorrhoids. - Recent CBC normal - Provided information on fiber intake to aid bowel movements.   Primary hypertension Assessment & Plan: Well-controlled on current medication regimen. -Continue current medications. -Follow with Cardiology     PDMP reviewed  Return if symptoms worsen or fail to improve, for PCP.  Valli Gaw, MD

## 2023-09-18 ENCOUNTER — Encounter: Payer: Self-pay | Admitting: Family Medicine

## 2023-09-18 DIAGNOSIS — K648 Other hemorrhoids: Secondary | ICD-10-CM | POA: Insufficient documentation

## 2023-09-18 NOTE — Assessment & Plan Note (Signed)
 Well-controlled on current medication regimen. -Continue current medications. -Follow with Cardiology

## 2023-09-18 NOTE — Assessment & Plan Note (Addendum)
 Chronic.  Seen on last colonoscopy.  Intermittent bright red blood on stool likely due to internal hemorrhoids. Hemoglobin levels previously stable, re-evaluation needed. Discussed potential hemorrhoid banding if bleeding significant.  - Order stool sample for occult blood. - Follow up with GI for further evaluation and potential banding of hemorrhoids. - Recent CBC normal - Provided information on fiber intake to aid bowel movements.

## 2023-09-26 ENCOUNTER — Ambulatory Visit (INDEPENDENT_AMBULATORY_CARE_PROVIDER_SITE_OTHER)

## 2023-09-26 DIAGNOSIS — K625 Hemorrhage of anus and rectum: Secondary | ICD-10-CM | POA: Diagnosis not present

## 2023-09-26 LAB — FECAL OCCULT BLOOD, IMMUNOCHEMICAL: Fecal Occult Bld: NEGATIVE

## 2023-11-15 ENCOUNTER — Other Ambulatory Visit: Payer: Self-pay

## 2023-11-15 ENCOUNTER — Encounter: Payer: Self-pay | Admitting: Family Medicine

## 2023-11-15 NOTE — Telephone Encounter (Signed)
 Sent to provider to see if she could get filled.

## 2023-11-16 MED ORDER — VALACYCLOVIR HCL 500 MG PO TABS: 500.0000 mg | ORAL_TABLET | Freq: Every day | ORAL | 3 refills | Status: DC

## 2023-12-27 DIAGNOSIS — I110 Hypertensive heart disease with heart failure: Secondary | ICD-10-CM | POA: Diagnosis not present

## 2023-12-27 DIAGNOSIS — Z79899 Other long term (current) drug therapy: Secondary | ICD-10-CM | POA: Diagnosis not present

## 2023-12-27 DIAGNOSIS — K760 Fatty (change of) liver, not elsewhere classified: Secondary | ICD-10-CM | POA: Diagnosis not present

## 2023-12-27 DIAGNOSIS — G4733 Obstructive sleep apnea (adult) (pediatric): Secondary | ICD-10-CM | POA: Diagnosis not present

## 2023-12-27 DIAGNOSIS — R0609 Other forms of dyspnea: Secondary | ICD-10-CM | POA: Diagnosis not present

## 2023-12-27 DIAGNOSIS — I10 Essential (primary) hypertension: Secondary | ICD-10-CM | POA: Diagnosis not present

## 2023-12-27 DIAGNOSIS — E559 Vitamin D deficiency, unspecified: Secondary | ICD-10-CM | POA: Diagnosis not present

## 2023-12-27 DIAGNOSIS — I5020 Unspecified systolic (congestive) heart failure: Secondary | ICD-10-CM | POA: Diagnosis not present

## 2023-12-27 DIAGNOSIS — E538 Deficiency of other specified B group vitamins: Secondary | ICD-10-CM | POA: Diagnosis not present

## 2023-12-27 DIAGNOSIS — R5383 Other fatigue: Secondary | ICD-10-CM | POA: Diagnosis not present

## 2023-12-27 DIAGNOSIS — E66813 Obesity, class 3: Secondary | ICD-10-CM | POA: Diagnosis not present

## 2023-12-27 DIAGNOSIS — O903 Peripartum cardiomyopathy: Secondary | ICD-10-CM | POA: Diagnosis not present

## 2024-01-03 DIAGNOSIS — I371 Nonrheumatic pulmonary valve insufficiency: Secondary | ICD-10-CM | POA: Diagnosis not present

## 2024-01-05 ENCOUNTER — Ambulatory Visit (INDEPENDENT_AMBULATORY_CARE_PROVIDER_SITE_OTHER): Admitting: Nurse Practitioner

## 2024-01-05 ENCOUNTER — Encounter: Payer: Self-pay | Admitting: Nurse Practitioner

## 2024-01-05 ENCOUNTER — Ambulatory Visit

## 2024-01-05 VITALS — BP 112/74 | HR 76 | Temp 98.5°F | Ht 64.0 in | Wt 237.6 lb

## 2024-01-05 DIAGNOSIS — K625 Hemorrhage of anus and rectum: Secondary | ICD-10-CM | POA: Insufficient documentation

## 2024-01-05 DIAGNOSIS — M25572 Pain in left ankle and joints of left foot: Secondary | ICD-10-CM

## 2024-01-05 DIAGNOSIS — Z1231 Encounter for screening mammogram for malignant neoplasm of breast: Secondary | ICD-10-CM

## 2024-01-05 DIAGNOSIS — M7732 Calcaneal spur, left foot: Secondary | ICD-10-CM | POA: Diagnosis not present

## 2024-01-05 NOTE — Assessment & Plan Note (Signed)
 Intermittent rectal bleeding with increased frequency and volume. Bright red blood noted. Internal hemorrhoids identified on colonoscopy 2024  - Referral placed  to GI  (Pt preference Dr. Cleo) for further evaluation and management.  - Provide fecal occult blood test.

## 2024-01-05 NOTE — Patient Instructions (Signed)
 You came in today to discuss referrals for a mammogram and to evaluate your rectal bleeding. We also addressed your left ankle pain and swelling.  YOUR PLAN:  RECTAL BLEEDING: You have been experiencing rectal bleeding with increased frequency and volume. The blood is bright red and was previously associated with bowel movements but now occurs independently. Internal hemorrhoids were noted during a previous colonoscopy. -You will be referred to a GI specialist at [facility], preferably Dr. Roxanne. -A fecal occult blood test will be provided to you.  LEFT ANKLE PAIN AND SWELLING: You have had intermittent left ankle pain and swelling for two months, which worsened after a stumble. There is a tender knot on the back of your ankle, and the pain worsens with movement or pressure. -An x-ray of your left ankle will be ordered. -Use warm compresses and arthritic cream to alleviate the pain. -Wrap your ankle with a bandage for additional support.  GENERAL HEALTH MAINTENANCE: Routine mammogram screening has been delayed. -You will be referred for a mammogram screening.

## 2024-01-05 NOTE — Assessment & Plan Note (Signed)
 Intermittent left ankle pain and swelling for two months, exacerbated by a stumble. Tender knot on the back of the ankle. Pain worsens with movement or pressure. - Order x-ray of ankle. - Advise warm compresses and arthritic cream, no pain at present. - Suggest wrapping ankle with bandage.

## 2024-01-05 NOTE — Progress Notes (Signed)
 Established Patient Office Visit  Subjective:  Patient ID: Stacie Gill, female    DOB: 20-Jan-1976  Age: 48 y.o. MRN: [id number]  CC:  Chief Complaint  Patient presents with   Acute Visit    Mammogram order  Left ankle knot toward back of ankle & tender Blood in stool and sometimes just blood may need referral to [facility]   Discussed the use of a AI scribe software for clinical note transcription with the patient, who gave verbal consent to proceed.  HPI  Stacie Gill presents for acute visit. She needs referral for mammogram and rectal bleeding.   Rectal bleeding has been present for most of the year, with increased frequency and volume. Initially associated with bowel movements, it now occurs independently. The blood is bright red, visible on tissue or in the toilet after urination. Internal hemorrhoids were noted during a previous colonoscopy.  She experiences multiple soft bowel movements daily.There is no constipation, abdominal bloating, or belching, but she reports intermittent abdominal discomfort and a feeling of incomplete evacuation. Intermittent point tenderness/ discomfort near the umbilical region  especially post-bowel movements, with occasional palpable movement under the skin. No tenderness at present  Ankle pain has been present for most of the year, worsening after a stumble two months ago. It is intermittent, with a knot and tenderness at the back of the ankle, aggravated by certain movements and shoe wear. NO pain at present  HPI   Past Medical History:  Diagnosis Date   Cardiomyopathy (HCC)    Chickenpox    Fibroids    Focal nodular hyperplasia of liver    GERD (gastroesophageal reflux disease)    Hypertension    Prediabetes    Strep throat    UTI (lower urinary tract infection)     Past Surgical History:  Procedure Laterality Date   CHOLECYSTECTOMY  05/24/2001   CHOLECYSTECTOMY     COLONOSCOPY WITH PROPOFOL  N/A 07/30/2022   Procedure:  COLONOSCOPY WITH PROPOFOL ;  Surgeon: Onita Elspeth Sharper, DO;  Location: [facility] ENDOSCOPY;  Service: Gastroenterology;  Laterality: N/A;    Family History  Problem Relation Age of Onset   Heart disease Mother    Prostate cancer Father    Healthy Daughter    Uterine cancer Maternal Aunt    Breast cancer Cousin    Alcoholism Other        Uncle   Arthritis Other        Grandparent   Colon cancer Other        Great grandparent, great uncle   Stroke Other        Great grandparent   Hypertension Other        Parent   Bone cancer Other        Uncle    Social History   Socioeconomic History   Marital status: Married    Spouse name: Not on file   Number of children: Not on file   Years of education: Not on file   Highest education level: Not on file  Occupational History   Not on file  Tobacco Use   Smoking status: Never   Smokeless tobacco: Never  Vaping Use   Vaping status: Never Used  Substance and Sexual Activity   Alcohol use: Yes    Alcohol/week: 0.0 standard drinks of alcohol    Comment: 1 a month, rare   Drug use: No   Sexual activity: Not Currently  Other Topics Concern   Not on file  Social History Narrative   Lives with husband and 2 children in a one story home.     Works at call center.  Education: associates degree.   Husband as of 05/02/19 on dialysis    Social Drivers of Health   Financial Resource Strain: Not on file  Food Insecurity: Not on file  Transportation Needs: Not on file  Physical Activity: Not on file  Stress: Not on file  Social Connections: Not on file  Intimate Partner Violence: Not on file     Outpatient Medications Prior to Visit  Medication Sig Dispense Refill   Ascorbic Acid (VITAMIN C PO) Take by mouth daily.     clindamycin  (CLEOCIN  T) 1 % lotion Apply topically 2 (two) times daily. Bid under arms 60 mL 11   cyanocobalamin  (VITAMIN B12) 1000 MCG/ML injection Inject 1,000 mcg into the muscle once.     ENTRESTO 97-103 MG  Take 1 tablet by mouth 2 (two) times daily.     Esomeprazole Magnesium (NEXIUM PO) Take by mouth as needed.     fexofenadine (ALLEGRA) 180 MG tablet Take 180 mg by mouth daily.     fluticasone  (FLONASE ) 50 MCG/ACT nasal spray Place 2 sprays into both nostrils daily. 16 g 0   hydrochlorothiazide (HYDRODIURIL) 25 MG tablet Take 25 mg by mouth daily. ON HOLD PER CARDIOLOGY (Patient taking differently: Take 25 mg by mouth daily. ON HOLD PER CARDIOLOGY)     JARDIANCE 10 MG TABS tablet Take 10 mg by mouth daily.     levonorgestrel  (MIRENA ) 20 MCG/24HR IUD 1 each by Intrauterine route once.     metoprolol  succinate (TOPROL -XL) 50 MG 24 hr tablet Take 25 mg by mouth 2 (two) times daily.     minocycline (MINOCIN) 100 MG capsule Take 100 mg by mouth as needed.     Multiple Vitamins-Minerals (ZINC PO) Take by mouth daily.     triamcinolone ointment (KENALOG) 0.1 % Apply 1 Application topically as needed.     valACYclovir  (VALTREX ) 500 MG tablet Take 1 tablet (500 mg total) by mouth daily. 90 tablet 3   Vitamin D , Ergocalciferol , (DRISDOL ) 1.25 MG (50000 UNIT) CAPS capsule Take 1 capsule (50,000 Units total) by mouth every 7 (seven) days. 12 capsule 3   No facility-administered medications prior to visit.    Allergies  Allergen Reactions   Coreg [Carvedilol]     sob   Norvasc  [Amlodipine ]     5 mg leg swelling     ROS Review of Systems Negative unless indicated in HPI.    Objective:    Physical Exam Constitutional:      Appearance: Normal appearance.  HENT:     Mouth/Throat:     Mouth: Mucous membranes are moist.  Eyes:     Conjunctiva/sclera: Conjunctivae normal.     Pupils: Pupils are equal, round, and reactive to light.  Cardiovascular:     Rate and Rhythm: Normal rate and regular rhythm.     Pulses: Normal pulses.     Heart sounds: Normal heart sounds.  Pulmonary:     Effort: Pulmonary effort is normal.     Breath sounds: Normal breath sounds.  Abdominal:     General: Bowel  sounds are normal.     Palpations: Abdomen is soft.     Tenderness: There is no abdominal tenderness. There is no rebound.  Musculoskeletal:     Cervical back: Normal range of motion. No tenderness.  Skin:    General: Skin is warm.  Findings: No bruising.  Neurological:     General: No focal deficit present.     Mental Status: She is alert and oriented to person, place, and time. Mental status is at baseline.  Psychiatric:        Mood and Affect: Mood normal.        Behavior: Behavior normal.        Thought Content: Thought content normal.        Judgment: Judgment normal.     BP 112/74   Pulse 76   Temp 98.5 F (36.9 C)   Ht 5' 4 (1.626 m)   Wt 237 lb 9.6 oz (107.8 kg)   SpO2 98%   BMI 40.78 kg/m  Wt Readings from Last 3 Encounters:  01/05/24 237 lb 9.6 oz (107.8 kg)  09/13/23 236 lb 6 oz (107.2 kg)  08/25/23 239 lb 4 oz (108.5 kg)     Health Maintenance  Topic Date Due   Pneumococcal Vaccine: 19-49 Years (1 of 2 - PCV) Never done   Hepatitis B Vaccines 19-59 Average Risk (1 of 3 - 19+ 3-dose series) Never done   Cervical Cancer Screening (HPV/Pap Cotest)  07/09/2023   INFLUENZA VACCINE  08/21/2024 (Originally 12/23/2023)   DTaP/Tdap/Td (2 - Td or Tdap) 11/18/2031   Colonoscopy  07/29/2032   Hepatitis C Screening  Completed   HIV Screening  Completed   HPV VACCINES  Aged Out   Meningococcal B Vaccine  Aged Out   COVID-19 Vaccine  Discontinued       Topic Date Due   Hepatitis B Vaccines 19-59 Average Risk (1 of 3 - 19+ 3-dose series) Never done    Lab Results  Component Value Date   TSH 1.02 09/13/2022   Lab Results  Component Value Date   WBC 7.5 08/25/2023   HGB 13.1 08/25/2023   HCT 40.6 08/25/2023   MCV 86.0 08/25/2023   PLT 317.0 08/25/2023   Lab Results  Component Value Date   NA 138 08/25/2023   K 4.1 08/25/2023   CO2 28 08/25/2023   GLUCOSE 81 08/25/2023   BUN 16 08/25/2023   CREATININE 1.22 (H) 08/25/2023   BILITOT 0.6 08/25/2023    ALKPHOS 69 08/25/2023   AST 9 08/25/2023   ALT 10 08/25/2023   PROT 7.6 08/25/2023   ALBUMIN 4.3 08/25/2023   CALCIUM 9.1 08/25/2023   GFR 52.45 (L) 08/25/2023   Lab Results  Component Value Date   CHOL 139 09/13/2022   Lab Results  Component Value Date   HDL 32.00 (L) 09/13/2022   Lab Results  Component Value Date   LDLCALC 87 09/13/2022   Lab Results  Component Value Date   TRIG 99.0 09/13/2022   Lab Results  Component Value Date   CHOLHDL 4 09/13/2022   Lab Results  Component Value Date   HGBA1C 5.9 09/13/2022      Assessment & Plan:  Breast cancer screening by mammogram -     3D Screening Mammogram, Left and Right; Future  Rectal bleeding Assessment & Plan: Intermittent rectal bleeding with increased frequency and volume. Bright red blood noted. Internal hemorrhoids identified on colonoscopy 2024  - Referral placed  to GI  (Pt preference Dr. Cleo) for further evaluation and management.  - Provide fecal occult blood test.  Orders: -     Fecal occult blood, imunochemical; Future -     Ambulatory referral to Gastroenterology  Left ankle pain, unspecified chronicity Assessment & Plan: Intermittent left ankle pain  and swelling for two months, exacerbated by a stumble. Tender knot on the back of the ankle. Pain worsens with movement or pressure. - Order x-ray of ankle. - Advise warm compresses and arthritic cream, no pain at present. - Suggest wrapping ankle with bandage.   Orders: -     DG Foot Complete Left; Future    Follow-up: Return if symptoms worsen or fail to improve.   Quenton Recendez, NP

## 2024-01-13 ENCOUNTER — Encounter: Payer: Self-pay | Admitting: Nurse Practitioner

## 2024-01-19 ENCOUNTER — Ambulatory Visit: Payer: Self-pay | Admitting: Nurse Practitioner

## 2024-03-15 DIAGNOSIS — K625 Hemorrhage of anus and rectum: Secondary | ICD-10-CM | POA: Diagnosis not present

## 2024-03-28 ENCOUNTER — Encounter: Payer: Self-pay | Admitting: Emergency Medicine

## 2024-03-28 ENCOUNTER — Emergency Department

## 2024-03-28 ENCOUNTER — Other Ambulatory Visit: Payer: Self-pay

## 2024-03-28 ENCOUNTER — Emergency Department
Admission: EM | Admit: 2024-03-28 | Discharge: 2024-03-28 | Disposition: A | Discharge status: Home / Self Care | Source: Ambulatory Visit | Attending: Emergency Medicine | Admitting: Emergency Medicine

## 2024-03-28 ENCOUNTER — Ambulatory Visit: Payer: Self-pay

## 2024-03-28 DIAGNOSIS — I5020 Unspecified systolic (congestive) heart failure: Secondary | ICD-10-CM | POA: Diagnosis not present

## 2024-03-28 DIAGNOSIS — D219 Benign neoplasm of connective and other soft tissue, unspecified: Secondary | ICD-10-CM

## 2024-03-28 DIAGNOSIS — D251 Intramural leiomyoma of uterus: Secondary | ICD-10-CM | POA: Diagnosis not present

## 2024-03-28 DIAGNOSIS — I110 Hypertensive heart disease with heart failure: Secondary | ICD-10-CM | POA: Diagnosis not present

## 2024-03-28 DIAGNOSIS — D259 Leiomyoma of uterus, unspecified: Secondary | ICD-10-CM | POA: Diagnosis not present

## 2024-03-28 DIAGNOSIS — N939 Abnormal uterine and vaginal bleeding, unspecified: Secondary | ICD-10-CM

## 2024-03-28 DIAGNOSIS — Z975 Presence of (intrauterine) contraceptive device: Secondary | ICD-10-CM | POA: Diagnosis not present

## 2024-03-28 LAB — CBC WITH DIFFERENTIAL/PLATELET
Abs Immature Granulocytes: 0.03 K/uL (ref 0.00–0.07)
Basophils Absolute: 0 K/uL (ref 0.0–0.1)
Basophils Relative: 0 %
Eosinophils Absolute: 0.1 K/uL (ref 0.0–0.5)
Eosinophils Relative: 1 %
HCT: 42.5 % (ref 36.0–46.0)
Hemoglobin: 13.3 g/dL (ref 12.0–15.0)
Immature Granulocytes: 0 %
Lymphocytes Relative: 38 %
Lymphs Abs: 3.2 K/uL (ref 0.7–4.0)
MCH: 27.1 pg (ref 26.0–34.0)
MCHC: 31.3 g/dL (ref 30.0–36.0)
MCV: 86.7 fL (ref 80.0–100.0)
Monocytes Absolute: 0.6 K/uL (ref 0.1–1.0)
Monocytes Relative: 7 %
Neutro Abs: 4.5 K/uL (ref 1.7–7.7)
Neutrophils Relative %: 54 %
Platelets: 326 K/uL (ref 150–400)
RBC: 4.9 MIL/uL (ref 3.87–5.11)
RDW: 14.6 % (ref 11.5–15.5)
WBC: 8.4 K/uL (ref 4.0–10.5)
nRBC: 0 % (ref 0.0–0.2)

## 2024-03-28 LAB — URINALYSIS, ROUTINE W REFLEX MICROSCOPIC
Bilirubin Urine: NEGATIVE
Glucose, UA: >=500 mg/dL — AB
Ketones, ur: NEGATIVE mg/dL
Nitrite: NEGATIVE
Protein, ur: 100 mg/dL — AB
RBC / HPF: >50 RBC/hpf (ref 0–5)
Specific Gravity, Urine: 1.014 (ref 1.005–1.030)
WBC, UA: >50 WBC/hpf (ref 0–5)
pH: 5 (ref 5.0–8.0)

## 2024-03-28 LAB — COMPREHENSIVE METABOLIC PANEL WITH GFR
ALT: 18 U/L (ref 0–44)
AST: 13 U/L — ABNORMAL LOW (ref 15–41)
Albumin: 4 g/dL (ref 3.5–5.0)
Alkaline Phosphatase: 73 U/L (ref 38–126)
Anion gap: 9 (ref 5–15)
BUN: 13 mg/dL (ref 6–20)
CO2: 26 mmol/L (ref 22–32)
Calcium: 8.9 mg/dL (ref 8.9–10.3)
Chloride: 103 mmol/L (ref 98–111)
Creatinine, Ser: 0.92 mg/dL (ref 0.44–1.00)
GFR, Estimated: >60 mL/min (ref >60)
Glucose, Bld: 87 mg/dL (ref 70–99)
Potassium: 4.1 mmol/L (ref 3.5–5.1)
Sodium: 138 mmol/L (ref 135–145)
Total Bilirubin: 0.9 mg/dL (ref 0.0–1.2)
Total Protein: 7.8 g/dL (ref 6.5–8.1)

## 2024-03-28 LAB — POC URINE PREG, ED: Preg Test, Ur: NEGATIVE

## 2024-03-28 MED ORDER — IBUPROFEN 600 MG PO TABS: 600.0000 mg | ORAL_TABLET | Freq: Three times a day (TID) | ORAL | 0 refills | Status: AC | PRN

## 2024-03-28 NOTE — ED Triage Notes (Signed)
 Pt to ER states this AM she had lower abdominal pain and cramping along with lower back pain.  Pt also reports heavy vaginal bleeding which is extremely unusual for her.  Pt contact PCP who told her to come out for further evaluation.

## 2024-03-28 NOTE — Telephone Encounter (Signed)
 Please make sure pt goes to the ED asap!

## 2024-03-28 NOTE — Telephone Encounter (Signed)
 FYI Only or Action Required?: FYI only for provider: ED advised.  Patient was last seen in primary care on 01/05/2024 by Stacie Saber, NP.  Called Nurse Triage reporting Vaginal Bleeding, Back Pain, and Abdominal Cramping.  Symptoms began today.  Interventions attempted: Rest, hydration, or home remedies.  Symptoms are: gradually worsening.  Triage Disposition: See HCP Within 4 Hours (Or PCP Triage)  Patient/caregiver understands and will follow disposition?:   Copied from CRM [id number]. Topic: Clinical - Red Word Triage >> Mar 28, 2024 12:03 PM Stacie Gill wrote: Red Word that prompted transfer to Nurse Triage: heavy blood, lower back pain, severe abdominal cramps   Pt stated that she is experiencing a heavy blood flow which clots and stated this is not normal. Pt is also experiencing severe abdominal cramps and lower back pain. Pt would like to schedule an appt with the provider. Reason for Disposition  MODERATE vaginal bleeding (e.g., soaking 1 pad or tampon per hour and present > 6 hours; 1 menstrual cup every 6 hours)  Answer Assessment - Initial Assessment Questions Patient reports abnormal vaginal bleeding. Patient does have an IUD and states she can have spotting but she hasn't had heavy bleeding with clots. Patient states the heavy bleeding and clots started today. Reports she bleed through her clothes once today. Reports abdominal cramping that had calmed down before she called triage. Patient is recommended to the ED as there are no appointments in the PCP office. Patient verbalized understanding.   1. BLEEDING SEVERITY: Describe the bleeding that you are having. How much bleeding is there?      Severe bleedings-has had to change her pad three times today. Patient states she has bled through her clothes and an overnight pad.  2. ONSET: When did the bleeding begin? Is it continuing now?     Spotting started a couple of days ago-developed severe bleeding today with blood  clots.  3. MENSTRUAL PERIOD: When was the last normal menstrual period? How is this different than your period?     Patient has an IUD so its hard for her to say what a normal period is 4. REGULARITY: How regular are your periods?     Patient reports she does have spotting or light bleeding.  5. ABDOMEN PAIN: Do you have any pain? How bad is the pain?  (e.g., Scale 0-10; none, mild, moderate, or severe)     2 out of 10 6. PREGNANCY: Is there any chance you are pregnant? When was your last menstrual period?     no 7. BREASTFEEDING: Are you breastfeeding?     no 8. HORMONE MEDICINES: Are you taking any hormone medicines, prescription or over-the-counter? (e.g., birth control pills, estrogen)     no 9. BLOOD THINNER MEDICINES: Do you take any blood thinners? (e.g., Coumadin / warfarin, Pradaxa / dabigatran, aspirin)     no 10. CAUSE: What do you think is causing the bleeding? (e.g., recent gyn surgery, recent gyn procedure; known bleeding disorder, cervical cancer, polycystic ovarian disease, fibroids)         uncertain 11. HEMODYNAMIC STATUS: Are you weak or feeling lightheaded? If Yes, ask: Can you stand and walk normally?        No weakness or lightheadedness today.  12. OTHER SYMPTOMS: What other symptoms are you having with the bleeding? (e.g., passed tissue, vaginal discharge, fever, menstrual-type cramps)       No other symptoms.  Protocols used: Vaginal Bleeding - Abnormal-A-AH

## 2024-03-28 NOTE — ED Notes (Signed)
Patient in Ultrasound.

## 2024-03-28 NOTE — Telephone Encounter (Signed)
 Patient returning a call. She states she is heading to the ED now. She had no further needs at this time.

## 2024-03-28 NOTE — Discharge Instructions (Addendum)
 You have been diagnosed with a normal altering bleeding, fibroids.  You can take ibuprofen every 8 hours after main meals for pain.  Please call to your OB/GYN and make an appointment for a follow-up.  I provide [facility] if you need to make an appointment with them.  Please in plenty of fluids.  Please come back to ED or go to your PCP if you have any symptoms symptoms worsen.  It was a pleasure to help you today.  Sincere Liuzzi, PA-C

## 2024-03-28 NOTE — Telephone Encounter (Signed)
 Attempted to reach patient to make her aware of recommendations and left message for patient. Advised patient to be seen in ER or local Urgent Care as there aren't any available appointments with any provider today.   OK for [id number] to give note if patient calls back. If relayed, please notify the office.

## 2024-03-28 NOTE — ED Provider Notes (Signed)
 [facility] Provider Note    Event Date/Time   First MD Initiated Contact with Patient 03/28/24 1526     (approximate)   History   Abdominal Pain and Vaginal Bleeding    HPI  Stacie Gill is a 48 y.o. female    with a past medical history of rectal bleeding, postpartum cardiomyopathy, steatotic liver, kidney stone, heart failure with reduced ejection fraction, hypertension, hematuria, B12 deficiency, who presents to the ED complaining of abdominal pain, heavy vaginal bleeding.. According to the patient, symptoms started today with abdominal pain described as a cramping, lower back pain at the same time. Patient reports having heavy menstrual period today with clots.  Patient denies having abdominal pain or back pain right now.  Usually she uses 2 pads per day when she has her menstrual period due to Mirena  IUD.  Today patient changed 4 times in the morning.  Menarche 48 years old, patient is G3 P2 A1, Mirena  IUD, cycles are irregular due to IUD.  Patient endorses having clear vaginal discharge, normal for her.  Patient denies fever, headache, chest pain, shortness of breath, diarrhea or urinary symptoms.  Patient endorses she was lightheaded in the last 2 weeks associated to maxillar sinus pressure.  Patient is here by herself.     Patient Active Problem List   Diagnosis Date Noted   Rectal bleeding 01/05/2024   Left ankle pain 01/05/2024   Internal hemorrhoids 09/18/2023   Burning with urination 09/07/2023   Hematuria 09/07/2023   DUB (dysfunctional uterine bleeding) 08/25/2023   IUD threads lost 08/25/2023   Facial asymmetry 03/18/2023   Genital herpes 09/19/2022   B12 deficiency 09/19/2022   Breast cancer screening by mammogram 09/19/2022   HFrEF (heart failure with reduced ejection fraction) (HCC) 03/03/2022   Nasal congestion 03/16/2021   Upper respiratory tract infection 03/16/2021   Abnormal MRI, thoracic spine 11/07/2020   Lumbar facet  arthropathy 11/07/2020   Abnormal MRI, lumbar spine 10/14/2020   Pain of both heels 08/26/2020   Benign cyst of left kidney 11/02/2019   Anxiety 11/02/2019   Morbid obesity with BMI of 40.0-44.9, adult (HCC) 11/02/2019   HTN (hypertension) 05/02/2019   Postpartum cardiomyopathy 05/02/2019   Dysphonia 10/27/2018   Prediabetes 06/19/2018   GERD (gastroesophageal reflux disease) 04/25/2018   Asthma 10/21/2017   Large breasts 10/21/2017   Focal nodular hyperplasia of liver 08/23/2017   Mid back pain 08/23/2017   Fatty liver 08/23/2017   Lung nodule 07/19/2017   Vitamin B12 deficiency 07/15/2017   Vitamin D  deficiency 07/15/2017   Diarrhea 07/15/2017   Low back pain 08/04/2015   Upper extremity weakness 08/04/2015   Memory change 08/04/2015   Hidradenitis suppurativa 08/04/2015   OSA (obstructive sleep apnea) 05/10/2014     Physical Exam   Triage Vital Signs: ED Triage Vitals  Encounter Vitals Group     BP 03/28/24 1500 136/84     Girls Systolic BP Percentile --      Girls Diastolic BP Percentile --      Boys Systolic BP Percentile --      Boys Diastolic BP Percentile --      Pulse Rate 03/28/24 1500 (!) 103     Resp 03/28/24 1500 17     Temp 03/28/24 1500 98.6 F (37 C)     Temp Source 03/28/24 1500 Oral     SpO2 03/28/24 1500 98 %     Weight 03/28/24 1500 235 lb (106.6 kg)  Height 03/28/24 1500 5' 4 (1.626 m)     Head Circumference --      Peak Flow --      Pain Score 03/28/24 1509 2     Pain Loc --      Pain Education --      Exclude from Growth Chart --     Most recent vital signs: Vitals:   03/28/24 1500  BP: 136/84  Pulse: (!) 103  Resp: 17  Temp: 98.6 F (37 C)  SpO2: 98%     Physical Exam Vitals and nursing note reviewed.  In triage patient was tachycardic  General:          Awake, no distress.  CV:                  Good peripheral perfusion.  Resp:               Normal effort. no tachypnea Abd:      Bowel sounds positive, skin is intact  no scars.           No distention.  Soft nontender Other:   Lumbar spine: Tenderness to palpation in the paraspinal muscles.  No tenderness to palpation in midline or spinal process. Lower extremities no calf pain.            ED Results / Procedures / Treatments   Labs (all labs ordered are listed, but only abnormal results are displayed) Labs Reviewed  URINALYSIS, ROUTINE W REFLEX MICROSCOPIC - Abnormal; Notable for the following components:      Result Value   Color, Urine AMBER (*)    APPearance HAZY (*)    Glucose, UA >=500 (*)    Hgb urine dipstick LARGE (*)    Protein, ur 100 (*)    Leukocytes,Ua TRACE (*)    Bacteria, UA RARE (*)    All other components within normal limits  COMPREHENSIVE METABOLIC PANEL WITH GFR - Abnormal; Notable for the following components:   AST 13 (*)    All other components within normal limits  CBC WITH DIFFERENTIAL/PLATELET  POC URINE PREG, ED      RADIOLOGY I independently reviewed and interpreted imaging and agree with radiologists findings.      PROCEDURES:  Critical Care performed:   Procedures   MEDICATIONS ORDERED IN ED: Medications - No data to display Clinical Course as of 03/28/24 1947  Wed Mar 28, 2024  1533 Urinalysis, Routine w reflex microscopic -Urine, Clean Catch(!) [AE]  1533 POC urine preg, ED Negative [AE]  1533 CBC with Differential/Platelet White blood cells, hemoglobin, platelets within normal limits [AE]  1533 Urinalysis, Routine w reflex microscopic -Urine, Clean Catch(!) HC, glucose more than 500, large hemoglobin, protein 100, leukocytes trace bacteria rare [AE]  1712 Comprehensive metabolic panel(!) Electrolytes, renal function, anion gap normal limits [AE]  1930 US  PELVIC COMPLETE W TRANSVAGINAL AND TORSION R/O . Poorly visible endometrial stripe due to multiple uterine fibroids. Moderate heterogeneous material in the endometrial and endocervical canal, possible hemorrhagic material. Low-lying iud in  the lower uterine segment and cervix. 2. Negative for ovarian torsion   [AE]  1931 Updated patient with results of ultrasound.  Patient is ready for discharge with OB/GYN follow-up.  Patient is agreeable with the plan.. [AE]    Clinical Course User Index [AE] Janit Kast, PA-C    IMPRESSION / MDM / ASSESSMENT AND PLAN / ED COURSE  I reviewed the triage vital signs and the nursing notes.  Differential diagnosis  includes, but is not limited to, fibroids, IUD bleeding, premenopausal bleeding.  Patient's presentation is most consistent with acute complicated illness / injury requiring diagnostic workup.   Allisen Pidgeon is a 48 y.o., female who presents today with history of abdominal pain described like cramping that radiated to the lower back pain.  Associated to heavy vaginal bleeding, with clots.  Patient changing 4 times pads today that is unusual for her.  On physical exam patient is tachycardic, normotensive.  Cardiopulmonary is clear.  Abdomen bowel sounds positive, skin is intact, no eschars, soft, no tenderness to palpation, no hepatosplenomegaly.  Rest of the physical exam is normal. Plan Waiting for results of pregnancy test, CBC and UA ordered during triage. Transvaginal UA Reassess Reassessed the patient after getting ultrasound.  Patient endorses mild pain in the left suprapubic area, offered pain medication but patient denied. Per independent chart review, patient had a ultrasound transabdominal and transvaginal in 2000 a with diagnosis of cyst in the right ovary.  Patient had a CT of the abdomen and pelvis without contrast in 2023 that reported slightly low-lying IUD that extended to the lower uterine segment.  US  pelvic complete with transvaginal ordered on August 17, 2022 reported enlarged uterus with multiple fibroids.  Patient's diagnosis is consistent with stable uterine fibroids.  Pregnancy test was negative, CBC CMP within normal limits, and UA showed no UTI. I  independently reviewed and interpreted imaging and agree with radiologists findings. Labs are  reassuring. I did review the patient's allergies and medications.The patient is in stable and satisfactory condition for discharge home  Patient will be discharged home with prescriptions for ibuprofen 650. Patient is to follow up with OB/GYN as needed or otherwise directed. Patient is given ED precautions to return to the ED for any worsening or new symptoms.  Offered note for work but patient declined at Discussed plan of care with patient, answered all of patient's questions, Patient agreeable to plan of care. Advised patient to take medications according to the instructions on the label. Discussed possible side effects of new medications. Patient verbalized understanding.  FINAL CLINICAL IMPRESSION(S) / ED DIAGNOSES   Final diagnoses:  Fibroids  Abnormal vaginal bleeding     Rx / DC Orders   ED Discharge Orders          Ordered    ibuprofen (ADVIL) 600 MG tablet  Every 8 hours PRN        03/28/24 1947             Note:  This document was prepared using Dragon voice recognition software and may include unintentional dictation errors.   Janit Kast, PA-C 03/28/24 1947    Arlander Charleston, MD 03/30/24 1053

## 2024-03-28 NOTE — Telephone Encounter (Signed)
 Noted

## 2024-04-11 ENCOUNTER — Telehealth: Payer: Self-pay | Admitting: Obstetrics & Gynecology

## 2024-04-11 NOTE — Telephone Encounter (Signed)
 Pt is calling in stating that she went to the ER on 03/28/2024 for lower back and pelvic pain and stated that she needs to be seen within 3-4 weeks.  Pt stated that she is having irregular cycles and she has a Hx: of fibroids.  Pt is wanting to see Dr. Herchel.  Pt is aware that we will send a message and give her a call back.

## 2024-04-17 NOTE — Progress Notes (Signed)
 Prior Authorization Request   Type of Request   [x]   Original (Date submitted: 04/16/2024   Medication Name and Strength: Sacubitril-Valsartan 97-103MG  tablets               Rx Insurance Information to which PA Submitted:     Rx [employer] Name:[employer] Pt ID#:[id number] RxGRP:[id number] MkAPW:[id number] RxPCN:[id number]   [x]  Patient notified on 04/17/2024  Completed PA Details:  DIAGNOSIS USED TO SUBMIT PA: I50.20 090.3  Completed PA authorization submitted via:    [x]    CoverMyMeds website on 04/17/2024 [facility] [email or web address]   PA Status   Status Date: 04/17/2024  For: [x]   Original                 [x]   Approved  Start Date:04/17/2024     End Date: 04/17/2027     Case/Reference ID#: [id number]       [facility] FRIE Medication Access Specialist

## 2024-04-24 ENCOUNTER — Ambulatory Visit: Admitting: Obstetrics & Gynecology

## 2024-04-24 ENCOUNTER — Encounter: Payer: Self-pay | Admitting: Obstetrics & Gynecology

## 2024-04-24 VITALS — BP 124/82 | HR 112 | Wt 246.0 lb

## 2024-04-24 DIAGNOSIS — T8332XS Displacement of intrauterine contraceptive device, sequela: Secondary | ICD-10-CM

## 2024-04-24 DIAGNOSIS — N939 Abnormal uterine and vaginal bleeding, unspecified: Secondary | ICD-10-CM

## 2024-04-24 DIAGNOSIS — D219 Benign neoplasm of connective and other soft tissue, unspecified: Secondary | ICD-10-CM

## 2024-04-24 LAB — CBC
Hematocrit: 38.2 % (ref 34.0–46.6)
Hemoglobin: 11.8 g/dL (ref 11.1–15.9)
MCH: 26.8 pg (ref 26.6–33.0)
MCHC: 30.9 g/dL — ABNORMAL LOW (ref 31.5–35.7)
MCV: 87 fL (ref 79–97)
Platelets: 382 x10E3/uL (ref 150–450)
RBC: 4.4 x10E6/uL (ref 3.77–5.28)
RDW: 14.3 % (ref 11.7–15.4)
WBC: 8.1 x10E3/uL (ref 3.4–10.8)

## 2024-04-24 MED ORDER — MEGESTROL ACETATE 40 MG PO TABS: 80.0000 mg | ORAL_TABLET | Freq: Two times a day (BID) | ORAL | 5 refills | Status: AC

## 2024-04-24 NOTE — Progress Notes (Signed)
 GYNECOLOGY OFFICE VISIT NOTE  History:  Stacie Gill is a 48 y.o. [id number] here today for evaluation of heavy vaginal bleeding in the setting of fibroids, and with an expired and malpositioned IUD in place.  She is requesting IUD replacement. Medical history significant for postpartum cardiomyopathy, HFrEF, HTN and she is followed closely by [facility]. She has been passing significant clots and feels very tired, no lightheadedness.  Hemoglobin on 03/28/24 was 14.6, normal TSH on 12/27/23. She denies any other concerns.  Past Medical History:  Diagnosis Date   Anxiety 11/02/2019   Asthma 10/21/2017   B12 deficiency 09/19/2022   Benign cyst of left kidney 11/02/2019   Cardiomyopathy (HCC)    Chickenpox    Fibroids    Focal nodular hyperplasia of liver    GERD (gastroesophageal reflux disease)    HFrEF (heart failure with reduced ejection fraction) (HCC) 03/03/2022   Hidradenitis suppurativa 08/04/2015   HTN (hypertension) 05/02/2019   Hypertension    Lumbar facet arthropathy 11/07/2020   Lung nodule 07/19/2017   06/20/17 CTA chest 0.2 cm right lower lobe   Ct chest 08/18/18 likely postinfectious         Morbid obesity with BMI of 40.0-44.9, adult (HCC) 11/02/2019   OSA (obstructive sleep apnea) 05/10/2014   Postpartum cardiomyopathy 05/02/2019   [facility] cards         Prediabetes    Strep throat    UTI (lower urinary tract infection)     Past Surgical History:  Procedure Laterality Date   CHOLECYSTECTOMY  05/24/2001   CHOLECYSTECTOMY     COLONOSCOPY WITH PROPOFOL  N/A 07/30/2022   Procedure: COLONOSCOPY WITH PROPOFOL ;  Surgeon: Onita Elspeth Sharper, DO;  Location: [facility] ENDOSCOPY;  Service: Gastroenterology;  Laterality: N/A;    The following portions of the patient's history were reviewed and updated as appropriate: allergies, current medications, past family history, past medical history, past social history, past surgical history and problem list.   Health  Maintenance:  Normal pap and negative HRHPV on 07/08/2020.  Mammogram scheduled on 05/18/24.   Review of Systems:  Pertinent items noted in HPI and remainder of comprehensive ROS otherwise negative.  Physical Exam: Chaperone Neva Hyler, CMA  BP 124/82   Pulse (!) 112   Wt 246 lb (111.6 kg)   BMI 42.23 kg/m  CONSTITUTIONAL: Well-developed, well-nourished female in no acute distress.  HEENT:  Normocephalic, atraumatic. External right and left ear normal. No scleral icterus.  NECK: Normal range of motion, supple, no masses noted on observation SKIN: No rash noted. Not diaphoretic. No erythema. No pallor. MUSCULOSKELETAL: Normal range of motion. No edema noted. NEUROLOGIC: Alert and oriented to person, place, and time. Normal muscle tone coordination. No cranial nerve deficit noted on observation. PSYCHIATRIC: Normal mood and affect. Normal behavior. Normal judgment and thought content. CARDIOVASCULAR: Normal heart rate noted RESPIRATORY: Effort and breath sounds normal, no problems with respiration noted ABDOMEN: No masses or other overt distention noted on observation. No tenderness.   PELVIC: Normal appearing external genitalia with a lot of blood noted on vulva; normal urethral meatus; normal appearing vaginal mucosa.  A large amount of blood and clots in vagina, also extruded IUD seen in vagina. Clots and IUD removed.  Large 2cm clot noted to be distending cervical os, this was removed, and patient had moderate ongoing bleeding.  Unable to clean cervix enough to do pap, and unable to biopsy as pipelle just kept filling up with blood when introduced into cavity (this was done  as a way to clear more clots in order to attempt biopsy). Unable to place new IUD, concerned about high risk of expulsion given amount of clots still present in uterus and ongoing bleeding.  Enlarged uterine size, no other palpable masses, no uterine or adnexal tenderness. Performed in the presence of a chaperone  Labs and  Imaging US  PELVIC COMPLETE W TRANSVAGINAL AND TORSION R/O Result Date: 03/28/2024 EXAM: US  Pelvis, Complete Transvaginal and Transabdominal with Doppler TECHNIQUE: Transabdominal and transvaginal pelvic duplex ultrasound using B-mode/gray scaled imaging with Doppler spectral analysis and color flow was obtained. COMPARISON: 08/17/2022 CLINICAL HISTORY: [id number] Vaginal bleeding 890711 Vaginal bleeding FINDINGS: UTERUS: Uterus measures 11.6x6.6x7.4 cm with a volume of 296.8 ml. Multiple fibroids. Right upper uterine fibroid measuring 4.4x4.4x4.5 cm, extends submucosal. Submucosal posterior uterine corpus fibroid measuring 2.5x1.9x1.7 cm. Subserosal left superior fibroid measuring 2.6x2x2.8 cm. ENDOMETRIAL STRIPE: Endometrium measures 3.2 mm in visible portions. Endometrial stripe is poorly visualized due to multiple fibroids. Moderate hypoechoic material within the uterine and endocervical canal. Low-lying iud, appears positioned within the cervix and lower uterine segment. RIGHT OVARY: Right ovary measures 3.2x1.8x1.9 cm with a volume of 5.5 ml. Right ovary is within normal limits. There is normal arterial and venous Doppler flow. LEFT OVARY: Left ovary measures 5.8x2.1x3.1 cm with a volume of 19.9 ml. Left ovary is within normal limits. There is normal arterial and venous Doppler flow. FREE FLUID: No free fluid. IMPRESSION: 1. Poorly visible endometrial stripe due to multiple uterine fibroids. Moderate heterogeneous material in the endometrial and endocervical canal, possible hemorrhagic material. Low-lying iud in the lower uterine segment and cervix. 2. Negative for ovarian torsion Electronically signed by: Luke Bun MD 03/28/2024 07:23 PM EST RP Workstation: [id number]    Assessment and Plan:     1. Malpositioned intrauterine device (IUD), sequela IUD seen in vagina and removed. Unable to place new IUD today.  2. Fibroids 3. Abnormal uterine bleeding (AUB) (Primary) CBC checked,will follow up results  and manage accordingly.  Advised to go to ER if she bleeds heavier or becomes more symptomatic.  Megace given for now to help with bleeding.  Patient will return in about 2 weeks for reevaluation; may get pap, endometrial biopsy and progestin IUD placement at that time. - CBC - megestrol (MEGACE) 40 MG tablet; Take 2 tablets (80 mg total) by mouth 2 (two) times daily. Until bleeding stops, then two tablets daily for maintenance.  Dispense: 90 tablet; Refill: 5   Return in about 2 weeks (around 05/08/2024) for Follow up AUB, possible IUD insertion, endometrial biopsy.    I spent 45 minutes dedicated to the care of this patient including pre-visit review of records, face to face time with the patient discussing her conditions and treatments, post visit ordering of medications and appropriate tests or procedures, coordinating care and documenting this visit encounter.    GLORIS HUGGER, MD, FACOG Obstetrician & Gynecologist, [facility] for [employer], [facility]

## 2024-04-25 ENCOUNTER — Ambulatory Visit: Payer: Self-pay | Admitting: Obstetrics & Gynecology

## 2024-05-04 ENCOUNTER — Ambulatory Visit

## 2024-05-04 VITALS — BP 120/70 | HR 100 | Temp 98.6°F | Ht 64.0 in | Wt 243.8 lb

## 2024-05-04 DIAGNOSIS — R0683 Snoring: Secondary | ICD-10-CM | POA: Insufficient documentation

## 2024-05-04 DIAGNOSIS — R7303 Prediabetes: Secondary | ICD-10-CM | POA: Diagnosis not present

## 2024-05-04 DIAGNOSIS — E559 Vitamin D deficiency, unspecified: Secondary | ICD-10-CM

## 2024-05-04 DIAGNOSIS — I10 Essential (primary) hypertension: Secondary | ICD-10-CM | POA: Diagnosis not present

## 2024-05-04 DIAGNOSIS — R0982 Postnasal drip: Secondary | ICD-10-CM | POA: Diagnosis not present

## 2024-05-04 DIAGNOSIS — I5020 Unspecified systolic (congestive) heart failure: Secondary | ICD-10-CM | POA: Diagnosis not present

## 2024-05-04 DIAGNOSIS — N939 Abnormal uterine and vaginal bleeding, unspecified: Secondary | ICD-10-CM

## 2024-05-04 DIAGNOSIS — A6000 Herpesviral infection of urogenital system, unspecified: Secondary | ICD-10-CM

## 2024-05-04 DIAGNOSIS — E538 Deficiency of other specified B group vitamins: Secondary | ICD-10-CM | POA: Diagnosis not present

## 2024-05-04 DIAGNOSIS — Z6841 Body Mass Index (BMI) 40.0 and over, adult: Secondary | ICD-10-CM | POA: Diagnosis not present

## 2024-05-04 MED ORDER — B-12 1000 MCG PO TABS: 2000.0000 ug | ORAL_TABLET | Freq: Every day | ORAL | 1 refills | Status: AC

## 2024-05-04 MED ORDER — VALACYCLOVIR HCL 500 MG PO TABS: 500.0000 mg | ORAL_TABLET | Freq: Every day | ORAL | 3 refills | Status: AC

## 2024-05-04 NOTE — Assessment & Plan Note (Addendum)
 She was previously taking hydrochlorothiazide 25 mg, stopped by cardiology due to episodes of low BP. BP within goal. Continue to follow up with cardiology.

## 2024-05-04 NOTE — Assessment & Plan Note (Signed)
 Continue medication management with : Cardiology: Dr. Harlene Gill [name].

## 2024-05-04 NOTE — Patient Instructions (Addendum)
-   Please start taking vitamin B12: 2000 mcg daily.  - Please take vitamin D  1000 units daily.  Please schedule a lab appointment  about 4 weeks. You don't need to be fasting for this.  I am referring you to sleep doctor, if you do not hear from them in 2 weeks to schedule an appointment please reach out to our office.

## 2024-05-04 NOTE — Assessment & Plan Note (Addendum)
 Known h/o B12 deficiency, previously treated with IM, not currently on supplement. Start oral vitamin B12 2000 mcg daily. Prescription sent. If not covered by pharmacy, recommend she starts this OTC. Will repeat B12, folate, intrinsic factor levels in four weeks. Orders:   Cyanocobalamin  (B-12) 1000 MCG TABS; Take 2 tablets (2,000 mcg total) by mouth at bedtime.   B12 and Folate Panel; Future   Intrinsic Factor Antibodies; Future

## 2024-05-04 NOTE — Assessment & Plan Note (Addendum)
 Bleeding stopped now. CBC from 04/24/24 reviewed. Check TSH, iron panel. Likely secondary to fibroids however other causes cannot be ruled out. Continue f/u with gynecologist Dr. Herchel for further management including endometrial biopsy.  Orders:   TSH; Future   Iron, TIBC and Ferritin Panel; Future

## 2024-05-04 NOTE — Progress Notes (Signed)
 Established Patient Office Visit TOC from Dr. Hope  GI: Dr. Buford PA, [place] [name] Cardiology: Dr. Harlene Gill [facility])  Gynecologist: Dr. Herchel ([facility])   Subjective  Patient ID: Stacie Gill, female    DOB: 09/19/1975  Age: 48 y.o. MRN: [id number]  Chief Complaint  Patient presents with   Establish Care    Discussed the use of AI scribe software for clinical note transcription with the patient, who gave verbal consent to proceed.  History of Present Illness Stacie Gill is a 48 year old female with history of postpartum cardiomyopathy, HFrEF, HTN, obesity, B12 deficiency, vitamin d  deficiency, fibroids with recent AUB, prediabetes who presents for transfer of care from previous PCP.    - Fibroids, AUB: She has a history of fibroids and has experienced significant changes in her menstrual cycle. Previously, her cycles were extremely light, but she began experiencing heavy bleeding and severe cramping, described as similar to labor pain, with radiation to her back. The bleeding was so heavy that she had to put herself on bed rest to manage it. She initially sought care at the ER due to the severity of the bleeding. A pelvic ultrasound on November 5th was performed to evaluate her symptoms.  - She has since then seen gynecologist Dr. Herchel. She is no longer bleeding.   She was taking  Megestrol   80 mg twice daily and is planning on taking 40 mg twice daily as instructed by gynecologist as she is no longer bleeding. She has a f/u appointment on 05/08/24 where she is planning on having endometrial biopsy and reinsertion of Mirena .    - Vitamin B12 deficiency: She has a history of vitamin B12 deficiency and was previously advised to take B12 injections, but her current supply is expired.  She takes Nexium once or twice a year for GERD.   -History of hidradenitis suppurativa, for which she uses clindamycin  lotion as needed and follows up with a  dermatologist.   - HTN, HFrEF, HTN:  Cardiology: Dr. Harlene Gill [name], current cardiology medications: including Sim, Jardiance, and metoprolol .   - History of genital herpes of suppression therapy with 500 mg daily Valtrex  to prevent transmission of herpes to her husband, who is immunocompromised following a kidney transplant.  - Recent uri like symptoms with postnasal drip: She has experienced recent sinus issues with thick white drainage .   - H/o rectal bleeding: She has a history of internal hemorrhoids and uses Anusol suppositories as needed. GI: Dr. Buford PA, [name]  - She has a history of vitamin D  deficiency and was previously on supplements.  - Prediabetes, snoring, obesity:  She has a sweet tooth and has been trying to manage her diet by eating healthier and reducing late-night meals. She has a history of snoring and was previously evaluated for sleep apnea, but the results were inconclusive. This was years ago and she is interested for re-evaluation. She stays busy but endorses cardiovascular activities can be improved.     ROS As per HPI    Objective:     BP 120/70 (BP Location: Right Arm, Patient Position: Sitting, Cuff Size: Normal)   Pulse 100   Temp 98.6 F (37 C) (Oral)   Ht 5' 4 (1.626 m)   Wt 243 lb 12.8 oz (110.6 kg)   SpO2 98%   BMI 41.85 kg/m      05/04/2024    9:54 AM 04/24/2024    1:29 PM 01/05/2024  8:48 AM  Depression screen PHQ 2/9  Decreased Interest 0 0 0  Down, Depressed, Hopeless 0 0 0  PHQ - 2 Score 0 0 0  Altered sleeping 0 0 0  Tired, decreased energy 0 0 0  Change in appetite 0 0 0  Feeling bad or failure about yourself  0 0 0  Trouble concentrating 0 0 0  Moving slowly or fidgety/restless 0 0 0  Suicidal thoughts 0 0 0  PHQ-9 Score 0 0 0   Difficult doing work/chores Not difficult at all  Not difficult at all     Data saved with a previous flowsheet row definition      05/04/2024    9:54 AM  04/24/2024    1:29 PM 01/05/2024    8:48 AM 09/13/2023   11:44 AM  GAD 7 : Generalized Anxiety Score  Nervous, Anxious, on Edge 1 1 1 1   Control/stop worrying 1 1 0 1  Worry too much - different things 0 0 0 0  Trouble relaxing 0 0 0 0  Restless 0 0 0 0  Easily annoyed or irritable 0 0 0 0  Afraid - awful might happen 1 1 1 1   Total GAD 7 Score 3 3 2 3   Anxiety Difficulty   Not difficult at all Somewhat difficult      05/04/2024    9:54 AM 04/24/2024    1:29 PM 01/05/2024    8:48 AM  Depression screen PHQ 2/9  Decreased Interest 0 0 0  Down, Depressed, Hopeless 0 0 0  PHQ - 2 Score 0 0 0  Altered sleeping 0 0 0  Tired, decreased energy 0 0 0  Change in appetite 0 0 0  Feeling bad or failure about yourself  0 0 0  Trouble concentrating 0 0 0  Moving slowly or fidgety/restless 0 0 0  Suicidal thoughts 0 0 0  PHQ-9 Score 0 0 0   Difficult doing work/chores Not difficult at all  Not difficult at all     Data saved with a previous flowsheet row definition      05/04/2024    9:54 AM 04/24/2024    1:29 PM 01/05/2024    8:48 AM 09/13/2023   11:44 AM  GAD 7 : Generalized Anxiety Score  Nervous, Anxious, on Edge 1 1 1 1   Control/stop worrying 1 1 0 1  Worry too much - different things 0 0 0 0  Trouble relaxing 0 0 0 0  Restless 0 0 0 0  Easily annoyed or irritable 0 0 0 0  Afraid - awful might happen 1 1 1 1   Total GAD 7 Score 3 3 2 3   Anxiety Difficulty   Not difficult at all Somewhat difficult   SDOH Screenings   Depression (PHQ2-9): Low Risk (05/04/2024)  Tobacco Use: Low Risk (05/04/2024)     Physical Exam Constitutional:      General: She is not in acute distress.    Appearance: Normal appearance. She is obese.  HENT:     Head: Normocephalic and atraumatic.     Right Ear: Tympanic membrane normal. There is no impacted cerumen.     Left Ear: Tympanic membrane normal. There is no impacted cerumen.     Nose: No congestion.     Mouth/Throat:     Mouth: Mucous  membranes are moist.  Cardiovascular:     Rate and Rhythm: Normal rate.  Pulmonary:     Effort: Pulmonary effort is normal.  Breath sounds: Normal breath sounds. No wheezing.  Abdominal:     Tenderness: There is no abdominal tenderness. There is no guarding or rebound.  Musculoskeletal:     Cervical back: Neck supple. No rigidity.     Right lower leg: No edema.     Left lower leg: No edema.  Skin:    General: Skin is warm.  Neurological:     Mental Status: She is alert and oriented to person, place, and time.  Psychiatric:        Mood and Affect: Mood normal.        No results found for any visits on 05/04/24.  The 10-year ASCVD risk score (Arnett DK, et al., 2019) is: 4.7%     Assessment & Plan:  Patient is a pleasant 48 year old female presenting for TOC.  Assessment & Plan Morbid obesity with BMI of 40.0-44.9, adult (HCC) Encouraged continuing with healthy dietary modifications and increased physical activity, cardiovascular exercise. Discussed potential benefits of weight loss on overall health.     Primary hypertension She was previously taking hydrochlorothiazide 25 mg, stopped by cardiology due to episodes of low BP. BP within goal. Continue to follow up with cardiology.      Prediabetes 12/27/23 A1c with 5.7%. Repeat A1c in one month with rest of her labs. Recommended to reduce intake of sweet food.  Orders:   Hemoglobin A1c; Future  Vitamin B12 deficiency Known h/o B12 deficiency, previously treated with IM, not currently on supplement. Start oral vitamin B12 2000 mcg daily. Prescription sent. If not covered by pharmacy, recommend she starts this OTC. Will repeat B12, folate, intrinsic factor levels in four weeks. Orders:   Cyanocobalamin  (B-12) 1000 MCG TABS; Take 2 tablets (2,000 mcg total) by mouth at bedtime.   B12 and Folate Panel; Future   Intrinsic Factor Antibodies; Future  Vitamin D  deficiency Known h/o vitamin D  deficiency in the past,  normalized with supplemental vitamin D . Repeat. Recommend patient starts daily vitamin D  1000 mcg daily.  Orders:   Vitamin D  (25 hydroxy); Future  Snoring STOP-BANG 3-4 Underwent OSA evaluation years ago which was inconclusive.  Referred to pulmonology for sleep apnea evaluation. Orders:   Ambulatory referral to Pulmonology  Abnormal uterine bleeding (AUB) Bleeding stopped now. CBC from 04/24/24 reviewed. Check TSH, iron panel. Likely secondary to fibroids however other causes cannot be ruled out. Continue f/u with gynecologist Dr. Herchel for further management including endometrial biopsy.  Orders:   TSH; Future   Iron, TIBC and Ferritin Panel; Future  Genital herpes simplex, unspecified site Stable on suppression therapy with Valtrex  500 mg daily, continue.  Orders:   valACYclovir  (VALTREX ) 500 MG tablet; Take 1 tablet (500 mg total) by mouth daily.  Postnasal drip Use Flonase  two puffs in each nostril daily for ten days.  Add Allegra if symptoms persist after two weeks.    HFrEF (heart failure with reduced ejection fraction) (HCC) Continue medication management with : Cardiology: Dr. Harlene Gill [name].      I personally spent a total of 45 minutes in the care of the patient today including preparing to see the patient, getting/reviewing separately obtained history, performing a medically appropriate exam/evaluation, counseling and educating, placing orders, referring and communicating with other health care professionals, documenting clinical information in the EHR, independently interpreting results, and communicating results.  Return for lab in 1 month, nonfasting. F/U with Dr. Abbey in about 6 months .   Luke Abbey, MD

## 2024-05-04 NOTE — Assessment & Plan Note (Signed)
 Stable on suppression therapy with Valtrex  500 mg daily, continue.  Orders:   valACYclovir  (VALTREX ) 500 MG tablet; Take 1 tablet (500 mg total) by mouth daily.

## 2024-05-04 NOTE — Assessment & Plan Note (Addendum)
 Known h/o vitamin D  deficiency in the past, normalized with supplemental vitamin D . Repeat. Recommend patient starts daily vitamin D  1000 mcg daily.  Orders:   Vitamin D  (25 hydroxy); Future

## 2024-05-04 NOTE — Assessment & Plan Note (Addendum)
 12/27/23 A1c with 5.7%. Repeat A1c in one month with rest of her labs. Recommended to reduce intake of sweet food.  Orders:   Hemoglobin A1c; Future

## 2024-05-04 NOTE — Assessment & Plan Note (Addendum)
 STOP-BANG 3-4 Underwent OSA evaluation years ago which was inconclusive.  Referred to pulmonology for sleep apnea evaluation. Orders:   Ambulatory referral to Pulmonology

## 2024-05-04 NOTE — Assessment & Plan Note (Signed)
 Use Flonase  two puffs in each nostril daily for ten days.  Add Allegra if symptoms persist after two weeks.

## 2024-05-04 NOTE — Assessment & Plan Note (Addendum)
 Encouraged continuing with healthy dietary modifications and increased physical activity, cardiovascular exercise. Discussed potential benefits of weight loss on overall health.

## 2024-05-08 ENCOUNTER — Ambulatory Visit: Admitting: Obstetrics & Gynecology

## 2024-05-08 ENCOUNTER — Other Ambulatory Visit (HOSPITAL_COMMUNITY)
Admission: RE | Admit: 2024-05-08 | Discharge: 2024-05-08 | Disposition: A | Source: Ambulatory Visit | Attending: Obstetrics & Gynecology | Admitting: Obstetrics & Gynecology

## 2024-05-08 ENCOUNTER — Encounter: Payer: Self-pay | Admitting: Obstetrics & Gynecology

## 2024-05-08 VITALS — BP 119/76 | HR 90 | Wt 243.0 lb

## 2024-05-08 DIAGNOSIS — Z113 Encounter for screening for infections with a predominantly sexual mode of transmission: Secondary | ICD-10-CM

## 2024-05-08 DIAGNOSIS — Z30430 Encounter for insertion of intrauterine contraceptive device: Secondary | ICD-10-CM

## 2024-05-08 DIAGNOSIS — N939 Abnormal uterine and vaginal bleeding, unspecified: Secondary | ICD-10-CM | POA: Diagnosis not present

## 2024-05-08 MED ORDER — LEVONORGESTREL 20 MCG/DAY IU IUD
1.0000 | INTRAUTERINE_SYSTEM | Freq: Once | INTRAUTERINE | Status: AC
  Administered 2024-05-08: 09:00:00 1 via INTRAUTERINE

## 2024-05-08 NOTE — Patient Instructions (Signed)

## 2024-05-08 NOTE — Progress Notes (Signed)
°  °  °  GYNECOLOGY OFFICE PROCEDURE NOTE   Stacie Gill is a 48 y.o. [id number] here for pap, endometrial biopsy and Mirena  IUD placement for abnormal uterine bleeding. Medical history significant for postpartum cardiomyopathy, HFrEF, HTN and she is followed closely by [facility]. Recent ultrasound also showed 12 week sized uterus with multiple fibroids (2 - 4 cm in size), two with submucosal components.  She also had an expired and expelled IUD noted during last visit on 04/24/24 that was removed during pelvic exam, unable to do the procedures due to heavy ongoing bleeding.  Megace  80 mg po bid prescribed, and she was told to return today.  She reports that her bleeding stopped after about one week of therapy.  Today, she reports no concerning symptoms. Of note, pap on 07/08/2020 was normal, negative HPV.  Mammogram scheduled on 05/18/24.   ENDOMETRIAL BIOPSY   AND IUD INSERTION PROCEDURE NOTE  (Chaperone Neva Hyler, CMA) The indications for endometrial biopsy were reviewed.   Risks of the biopsy including cramping, bleeding, infection, uterine perforation, inadequate specimen and need for additional procedures were discussed.     For the IUD insertion, discussed risks of irregular bleeding, cramping, infection, malpositioning or misplacement of the IUD outside the uterus which may require further procedure such as laparoscopy. Also discussed >99% contraception efficacy, increased risk of ectopic pregnancy with failure of method.   Emphasized that this did not protect against STIs, condoms recommended during all sexual encounters. The patient states she understands the R/B/I/A and agrees to undergo procedures today.   Consent was signed. Time out was performed. Chaperone was present during entire procedure.   Patient was positioned in dorsal lithotomy position. A vaginal speculum was placed.  The cervix was visualized, pap smear sample obtained.  The cervix was prepped with Betadine x 3. A  paracervical block was placed around the cervicovaginal junction using 10 ml of 2% Lidocaine . The 3 mm pipelle was easily introduced into the endometrial cavity without difficulty to a depth of 11 cm, and a moderate amount of tissue was obtained after three passes and sent to pathology.  Mirena  IUD was then placed per manufacturer's recommendations.  Strings trimmed to 3 cm.  Good hemostasis was noted. The instruments were removed from the patient's vagina. The patient tolerated the procedures well.   Patient was given post procedure instructions.  Will follow up pathology and manage accordingly; patient will be contacted with results and recommendations.    She was advised to continue the Megace  for next 2 weeks or so to mitigate the irregular bleeding side effect of the Mirena .  She will follow up in 4 weeks for IUD check.     GLORIS HUGGER, MD, FACOG Obstetrician & Gynecologist, [facility] for [employer], [facility]

## 2024-05-09 ENCOUNTER — Ambulatory Visit: Payer: Self-pay | Admitting: Obstetrics & Gynecology

## 2024-05-09 DIAGNOSIS — N939 Abnormal uterine and vaginal bleeding, unspecified: Secondary | ICD-10-CM

## 2024-05-09 LAB — CBC
Hematocrit: 36.1 % (ref 34.0–46.6)
Hemoglobin: 11.2 g/dL (ref 11.1–15.9)
MCH: 27 pg (ref 26.6–33.0)
MCHC: 31 g/dL — ABNORMAL LOW (ref 31.5–35.7)
MCV: 87 fL (ref 79–97)
Platelets: 460 x10E3/uL — ABNORMAL HIGH (ref 150–450)
RBC: 4.15 x10E6/uL (ref 3.77–5.28)
RDW: 14.5 % (ref 11.7–15.4)
WBC: 8.8 x10E3/uL (ref 3.4–10.8)

## 2024-05-09 LAB — RPR+HBSAG+HCVAB+...
HIV Screen 4th Generation wRfx: NONREACTIVE
Hep C Virus Ab: NONREACTIVE
Hepatitis B Surface Ag: NEGATIVE
RPR Ser Ql: NONREACTIVE

## 2024-05-10 LAB — CYTOLOGY - PAP
Chlamydia: NEGATIVE
Comment: NEGATIVE
Comment: NEGATIVE
Comment: NEGATIVE
Comment: NORMAL
Diagnosis: NEGATIVE
High risk HPV: NEGATIVE
Neisseria Gonorrhea: NEGATIVE
Trichomonas: NEGATIVE

## 2024-05-11 LAB — SURGICAL PATHOLOGY

## 2024-05-18 ENCOUNTER — Ambulatory Visit
Admission: RE | Admit: 2024-05-18 | Discharge: 2024-05-18 | Disposition: A | Discharge status: Home / Self Care | Source: Ambulatory Visit | Attending: Nurse Practitioner | Admitting: Nurse Practitioner

## 2024-05-18 DIAGNOSIS — Z1231 Encounter for screening mammogram for malignant neoplasm of breast: Secondary | ICD-10-CM | POA: Diagnosis not present

## 2024-06-04 ENCOUNTER — Telehealth: Payer: Self-pay

## 2024-06-04 ENCOUNTER — Other Ambulatory Visit (INDEPENDENT_AMBULATORY_CARE_PROVIDER_SITE_OTHER)

## 2024-06-04 DIAGNOSIS — R7303 Prediabetes: Secondary | ICD-10-CM

## 2024-06-04 DIAGNOSIS — E538 Deficiency of other specified B group vitamins: Secondary | ICD-10-CM | POA: Diagnosis not present

## 2024-06-04 DIAGNOSIS — N939 Abnormal uterine and vaginal bleeding, unspecified: Secondary | ICD-10-CM | POA: Diagnosis not present

## 2024-06-04 DIAGNOSIS — E559 Vitamin D deficiency, unspecified: Secondary | ICD-10-CM

## 2024-06-04 LAB — HEMOGLOBIN A1C: Hgb A1c MFr Bld: 5.7 % (ref 4.6–6.5)

## 2024-06-04 LAB — B12 AND FOLATE PANEL
Folate: 8.7 ng/mL
Vitamin B-12: 357 pg/mL (ref 211–911)

## 2024-06-04 LAB — TSH: TSH: 1.25 u[IU]/mL (ref 0.35–5.50)

## 2024-06-04 LAB — VITAMIN D 25 HYDROXY (VIT D DEFICIENCY, FRACTURES): VITD: 19.11 ng/mL — ABNORMAL LOW (ref 30.00–100.00)

## 2024-06-04 NOTE — Telephone Encounter (Signed)
 1. Vitamin B12 deficiency (Primary) - Intrinsic Factor Antibodies; Future  Luke Shade, MD

## 2024-06-07 ENCOUNTER — Ambulatory Visit: Payer: Self-pay

## 2024-06-07 DIAGNOSIS — E559 Vitamin D deficiency, unspecified: Secondary | ICD-10-CM

## 2024-06-07 DIAGNOSIS — E611 Iron deficiency: Secondary | ICD-10-CM

## 2024-06-07 LAB — INTRINSIC FACTOR ANTIBODIES: Intrinsic Factor Abs, Serum: 1 [AU]/ml (ref 0.0–1.1)

## 2024-06-07 MED ORDER — VITAMIN D (ERGOCALCIFEROL) 1.25 MG (50000 UNIT) PO CAPS: 50000.0000 [IU] | ORAL_CAPSULE | ORAL | 3 refills | Status: AC

## 2024-06-09 LAB — IRON,TIBC AND FERRITIN PANEL
Ferritin: 59 ng/mL (ref 15–150)
Iron Saturation: 8 % — CL (ref 15–55)
Iron: 22 ug/dL — ABNORMAL LOW (ref 27–159)
Total Iron Binding Capacity: 282 ug/dL (ref 250–450)
UIBC: 260 ug/dL (ref 131–425)

## 2024-06-09 LAB — SPECIMEN STATUS REPORT

## 2024-06-11 DIAGNOSIS — E611 Iron deficiency: Secondary | ICD-10-CM | POA: Insufficient documentation

## 2024-06-11 MED ORDER — IRON (FERROUS SULFATE) 325 (65 FE) MG PO TABS: 325.0000 mg | ORAL_TABLET | Freq: Every day | ORAL | 0 refills | Status: AC

## 2024-06-11 NOTE — Progress Notes (Signed)
"   Iron  deficiency - Iron , Ferrous Sulfate , 325 (65 Fe) MG TABS; Take 325 mg by mouth daily.  Dispense: 90 tablet; Refill: 0  "

## 2024-06-18 ENCOUNTER — Ambulatory Visit: Admitting: Obstetrics & Gynecology

## 2024-06-18 ENCOUNTER — Encounter: Admitting: Obstetrics & Gynecology

## 2024-06-21 ENCOUNTER — Ambulatory Visit (INDEPENDENT_AMBULATORY_CARE_PROVIDER_SITE_OTHER): Admitting: Sleep Medicine

## 2024-06-21 ENCOUNTER — Encounter: Payer: Self-pay | Admitting: Sleep Medicine

## 2024-06-21 VITALS — BP 126/84 | HR 92 | Temp 98.6°F | Ht 64.0 in | Wt 240.8 lb

## 2024-06-21 DIAGNOSIS — I509 Heart failure, unspecified: Secondary | ICD-10-CM | POA: Diagnosis not present

## 2024-06-21 DIAGNOSIS — Z6841 Body Mass Index (BMI) 40.0 and over, adult: Secondary | ICD-10-CM

## 2024-06-21 DIAGNOSIS — G4710 Hypersomnia, unspecified: Secondary | ICD-10-CM

## 2024-06-21 DIAGNOSIS — I5020 Unspecified systolic (congestive) heart failure: Secondary | ICD-10-CM

## 2024-06-21 DIAGNOSIS — G4733 Obstructive sleep apnea (adult) (pediatric): Secondary | ICD-10-CM

## 2024-06-21 NOTE — Addendum Note (Signed)
 Addended by: Jin Shockley on: 06/21/2024 04:16 PM   Modules accepted: Orders

## 2024-06-21 NOTE — Patient Instructions (Signed)
 Stacie Gill

## 2024-06-21 NOTE — Progress Notes (Signed)
 "      Name:Stacie Gill MRN: [id number] DOB: 1976/02/11   CHIEF COMPLAINT:  EXCESSIVE DAYTIME SLEEPINESS   HISTORY OF PRESENT ILLNESS: Stacie Gill is a 49 y.o. w/ a h/o HTN, CHF and morbid obesity who presents for c/o loud snoring and occasional daytime sleepiness which has been present for several years. Reports occasional nocturnal awakenings due to nocturia, however does not have difficulty falling back to sleep. Denies any significant weight changes. Admits to dry mouth. Denies morning headaches, RLS symptoms, dream enactment, cataplexy, hypnagogic or hypnapompic hallucinations. Denies a family history of sleep apnea. Denies drowsy driving. Drinks a few glasses of tea weekly and 1 soda daily, occasional alcohol use, denies tobacco or illicit drug use.   Bedtime 12-1 am Sleep onset 10 mins Rise time 7:45 am   EPWORTH SLEEP SCORE 7    06/21/2024    2:42 PM  Results of the Epworth flowsheet  Sitting and reading 0  Watching TV 2  Sitting, inactive in a public place (e.g. a theatre or a meeting) 0  As a passenger in a car for an hour without a break 0  Lying down to rest in the afternoon when circumstances permit 3  Sitting and talking to someone 0  Sitting quietly after a lunch without alcohol 2  In a car, while stopped for a few minutes in traffic 0  Total score 7    PAST MEDICAL HISTORY :   has a past medical history of Anxiety (11/02/2019), Asthma (10/21/2017), B12 deficiency (09/19/2022), Benign cyst of left kidney (11/02/2019), Cardiomyopathy (HCC), Chickenpox, Fibroids, Focal nodular hyperplasia of liver, GERD (gastroesophageal reflux disease), HFrEF (heart failure with reduced ejection fraction) (HCC) (03/03/2022), Hidradenitis suppurativa (08/04/2015), HTN (hypertension) (05/02/2019), Hypertension, Lumbar facet arthropathy (11/07/2020), Lung nodule (07/19/2017), Morbid obesity with BMI of 40.0-44.9, adult (HCC) (11/02/2019), OSA (obstructive sleep apnea) (05/10/2014),  Postpartum cardiomyopathy (05/02/2019), Prediabetes, Strep throat, and UTI (lower urinary tract infection).  has a past surgical history that includes Cholecystectomy (05/24/2001); Colonoscopy with propofol  (N/A, 07/30/2022); and Cholecystectomy. Prior to Admission medications  Medication Sig Start Date End Date Taking? Authorizing Provider  clindamycin  (CLEOCIN  T) 1 % lotion Apply topically 2 (two) times daily. Bid under arms 08/19/17  Yes McLean-Scocuzza, Randine SAILOR, MD  Cyanocobalamin  (B-12) 1000 MCG TABS Take 2 tablets (2,000 mcg total) by mouth at bedtime. 05/04/24  Yes Bair, Kalpana, MD  ENTRESTO 97-103 MG Take 1 tablet by mouth 2 (two) times daily. 11/21/22  Yes [provider]  Esomeprazole Magnesium (NEXIUM PO) Take by mouth as needed.   Yes [provider]  fexofenadine (ALLEGRA) 180 MG tablet Take 180 mg by mouth daily.   Yes [provider]  fluticasone  (FLONASE ) 50 MCG/ACT nasal spray Place 2 sprays into both nostrils daily. 03/16/21  Yes Wendee Lynwood HERO, NP  hydrocortisone (ANUSOL-HC) 25 MG suppository Place 25 mg rectally 2 (two) times daily. 03/29/24  Yes [provider]  Iron , Ferrous Sulfate , 325 (65 Fe) MG TABS Take 325 mg by mouth daily. 06/11/24  Yes Bair, Kalpana, MD  JARDIANCE 10 MG TABS tablet Take 10 mg by mouth daily.   Yes [provider]  megestrol  (MEGACE ) 40 MG tablet Take 2 tablets (80 mg total) by mouth 2 (two) times daily. Until bleeding stops, then two tablets daily for maintenance. 04/24/24  Yes Anyanwu, Ugonna A, MD  metoprolol  succinate (TOPROL -XL) 50 MG 24 hr tablet Take 25 mg by mouth 2 (two) times daily. 02/09/23  Yes [provider]  minocycline (MINOCIN) 100 MG capsule Take 100 mg by mouth as needed. 08/27/22  Yes [provider]  triamcinolone ointment (KENALOG) 0.1 % Apply 1 Application topically as needed. 08/27/22  Yes [provider]  valACYclovir  (VALTREX ) 500 MG tablet Take 1 tablet (500 mg  total) by mouth daily. 05/04/24  Yes Bair, Kalpana, MD  Vitamin D , Ergocalciferol , (DRISDOL ) 1.25 MG (50000 UNIT) CAPS capsule Take 1 capsule (50,000 Units total) by mouth every 7 (seven) days. 06/07/24  Yes Bair, Kalpana, MD  Ascorbic Acid (VITAMIN C PO) Take by mouth daily. Patient not taking: Reported on 06/21/2024    [provider]  Multiple Vitamins-Minerals (ZINC PO) Take by mouth daily. Patient not taking: Reported on 06/21/2024    [provider]   Allergies[1]  FAMILY HISTORY:  family history includes Alcoholism in an other family member; Arthritis in an other family member; Bone cancer in an other family member; Breast cancer in her cousin; Colon cancer in an other family member; Healthy in her daughter; Heart disease in her mother; Hypertension in an other family member; Prostate cancer in her father; Stroke in an other family member; Uterine cancer in her maternal aunt. SOCIAL HISTORY:  reports that she has never smoked. She has never used smokeless tobacco. She reports current alcohol use. She reports that she does not use drugs.   Review of Systems:  Gen:  Denies  fever, sweats, chills weight loss  HEENT: Denies blurred vision, double vision, ear pain, eye pain, hearing loss, nose bleeds, sore throat Cardiac:  No dizziness, chest pain or heaviness, chest tightness,edema, No JVD Resp:   No cough, -sputum production, -shortness of breath,-wheezing, -hemoptysis,  Gi: Denies swallowing difficulty, stomach pain, nausea or vomiting, diarrhea, constipation, bowel incontinence Gu:  Denies bladder incontinence, burning urine Ext:   Denies Joint pain, stiffness or swelling Skin: Denies  skin rash, easy bruising or bleeding or hives Endoc:  Denies polyuria, polydipsia , polyphagia or weight change Psych:   Denies depression, insomnia or hallucinations  Other:  All other systems negative  VITAL SIGNS: BP 126/84   Pulse 92   Temp 98.6 F (37 C)   Ht 5' 4 (1.626 m)    Wt 240 lb 12.8 oz (109.2 kg)   SpO2 99%   BMI 41.33 kg/m    Physical Examination:   General Appearance: No distress  EYES PERRLA, EOM intact.   NECK Supple, No JVD Pulmonary: normal breath sounds, No wheezing.  CardiovascularNormal S1,S2.  No m/r/g.   Abdomen: Benign, Soft, non-tender. Skin:   warm, no rashes, no ecchymosis  Extremities: normal, no cyanosis, clubbing. Neuro:without focal findings,  speech normal  PSYCHIATRIC: Mood, affect within normal limits.   ASSESSMENT AND PLAN  OSA I suspect that OSA is likely present due to clinical presentation. Discussed the consequences of untreated sleep apnea. Advised not to drive drowsy for safety of patient and others. Will complete further evaluation with a home sleep study and follow up to review results.    CHF Stable, on current management. Following with cardiology.   Morbid obesity Counseled patient on diet and lifestyle modification.     MEDICATION ADJUSTMENTS/LABS AND TESTS ORDERED: Recommend Sleep Study   Patient  satisfied with Plan of action and management. All questions answered  Follow up to review HST results and treatment plan.   I spent a total of 34 minutes reviewing chart data, face-to-face evaluation with the patient, counseling and coordination of care as detailed above.    Magnus Crescenzo,  M.D.  [facility]           [1]  Allergies Allergen Reactions   Coreg [Carvedilol]     sob   Norvasc  [Amlodipine ]     5 mg leg swelling    "

## 2024-06-26 ENCOUNTER — Encounter: Payer: Self-pay | Admitting: Obstetrics & Gynecology

## 2024-06-26 ENCOUNTER — Ambulatory Visit: Admitting: Obstetrics & Gynecology

## 2024-06-26 VITALS — BP 124/85 | HR 98 | Wt 245.0 lb

## 2024-06-26 DIAGNOSIS — N939 Abnormal uterine and vaginal bleeding, unspecified: Secondary | ICD-10-CM | POA: Diagnosis not present

## 2024-06-26 DIAGNOSIS — D219 Benign neoplasm of connective and other soft tissue, unspecified: Secondary | ICD-10-CM | POA: Diagnosis not present

## 2024-06-26 DIAGNOSIS — Z30431 Encounter for routine checking of intrauterine contraceptive device: Secondary | ICD-10-CM | POA: Diagnosis not present

## 2024-11-06 ENCOUNTER — Ambulatory Visit
